# Patient Record
Sex: Female | Born: 1950 | Race: White | Hispanic: No | Marital: Single | Smoking: Never smoker
Health system: Southern US, Community
[De-identification: ages and names within clinical notes are randomized; demographics above are authoritative.]

## PROBLEM LIST (undated history)

## (undated) DIAGNOSIS — N200 Calculus of kidney: Secondary | ICD-10-CM

## (undated) DIAGNOSIS — I10 Essential (primary) hypertension: Secondary | ICD-10-CM

## (undated) HISTORY — PX: OTHER SURGICAL HISTORY: SHX169

## (undated) HISTORY — PX: DILATION AND CURETTAGE, DIAGNOSTIC / THERAPEUTIC: SUR384

---

## 1998-05-14 ENCOUNTER — Other Ambulatory Visit: Admission: RE | Admit: 1998-05-14 | Discharge: 1998-05-14 | Payer: Self-pay | Admitting: Obstetrics and Gynecology

## 1998-12-24 ENCOUNTER — Encounter: Payer: Self-pay | Admitting: Urology

## 1998-12-24 ENCOUNTER — Encounter: Admission: RE | Admit: 1998-12-24 | Discharge: 1998-12-24 | Payer: Self-pay | Admitting: Urology

## 1999-07-14 ENCOUNTER — Encounter: Admission: RE | Admit: 1999-07-14 | Discharge: 1999-07-14 | Payer: Self-pay | Admitting: Urology

## 1999-07-14 ENCOUNTER — Encounter: Payer: Self-pay | Admitting: Urology

## 1999-10-03 ENCOUNTER — Other Ambulatory Visit: Admission: RE | Admit: 1999-10-03 | Discharge: 1999-10-03 | Payer: Self-pay | Admitting: Obstetrics and Gynecology

## 2000-01-09 ENCOUNTER — Encounter: Payer: Self-pay | Admitting: Urology

## 2000-01-09 ENCOUNTER — Encounter: Admission: RE | Admit: 2000-01-09 | Discharge: 2000-01-09 | Payer: Self-pay | Admitting: Urology

## 2000-05-28 ENCOUNTER — Encounter: Payer: Self-pay | Admitting: Urology

## 2000-05-28 ENCOUNTER — Encounter: Admission: RE | Admit: 2000-05-28 | Discharge: 2000-05-28 | Payer: Self-pay | Admitting: Urology

## 2001-04-27 ENCOUNTER — Encounter: Payer: Self-pay | Admitting: Urology

## 2001-04-27 ENCOUNTER — Encounter: Admission: RE | Admit: 2001-04-27 | Discharge: 2001-04-27 | Payer: Self-pay | Admitting: Urology

## 2001-05-23 ENCOUNTER — Other Ambulatory Visit: Admission: RE | Admit: 2001-05-23 | Discharge: 2001-05-23 | Payer: Self-pay | Admitting: Obstetrics and Gynecology

## 2003-03-15 ENCOUNTER — Ambulatory Visit (HOSPITAL_COMMUNITY): Admission: RE | Admit: 2003-03-15 | Discharge: 2003-03-15 | Payer: Self-pay | Admitting: Urology

## 2003-03-26 ENCOUNTER — Other Ambulatory Visit: Admission: RE | Admit: 2003-03-26 | Discharge: 2003-03-26 | Payer: Self-pay | Admitting: Obstetrics and Gynecology

## 2003-04-04 ENCOUNTER — Encounter: Admission: RE | Admit: 2003-04-04 | Discharge: 2003-04-04 | Payer: Self-pay | Admitting: Obstetrics and Gynecology

## 2004-05-15 ENCOUNTER — Other Ambulatory Visit: Admission: RE | Admit: 2004-05-15 | Discharge: 2004-05-15 | Payer: Self-pay | Admitting: Obstetrics and Gynecology

## 2004-08-03 ENCOUNTER — Emergency Department (HOSPITAL_COMMUNITY): Admission: EM | Admit: 2004-08-03 | Discharge: 2004-08-03 | Payer: Self-pay | Admitting: Emergency Medicine

## 2010-03-15 ENCOUNTER — Encounter: Payer: Self-pay | Admitting: Obstetrics and Gynecology

## 2014-11-19 ENCOUNTER — Encounter: Payer: Self-pay | Admitting: Family Medicine

## 2014-11-19 ENCOUNTER — Other Ambulatory Visit (INDEPENDENT_AMBULATORY_CARE_PROVIDER_SITE_OTHER): Payer: Commercial Managed Care - HMO

## 2014-11-19 ENCOUNTER — Ambulatory Visit (INDEPENDENT_AMBULATORY_CARE_PROVIDER_SITE_OTHER): Payer: Commercial Managed Care - HMO | Admitting: Family Medicine

## 2014-11-19 VITALS — BP 140/78 | HR 81 | Ht 65.0 in | Wt 199.0 lb

## 2014-11-19 DIAGNOSIS — M25569 Pain in unspecified knee: Secondary | ICD-10-CM

## 2014-11-19 DIAGNOSIS — M129 Arthropathy, unspecified: Secondary | ICD-10-CM | POA: Diagnosis not present

## 2014-11-19 DIAGNOSIS — IMO0002 Reserved for concepts with insufficient information to code with codable children: Secondary | ICD-10-CM | POA: Insufficient documentation

## 2014-11-19 NOTE — Assessment & Plan Note (Signed)
Severe arthritis of the knees bilaterally. We discussed different treatment options patient though has elected to try conservative therapy. Patient will try home exercises and work with athletic trainer today, icing, what activities to avoid, discussed proper shoewear. Patient given trial topical anti-inflammatory's. In addition of this patient will try over-the-counter natural supplementations. Patient and will come back and see me again in 4 weeks. Continuing to have pain we will need to consider possible injections. X-rays pending.

## 2014-11-19 NOTE — Patient Instructions (Addendum)
Good to see you.  Ice 20 minutes 2 times daily. Usually after activity and before bed. Exercises 3 times a week.  pennsaid pinkie amount topically 2 times daily as needed.  Take tylenol 650 mg three times a day is the best evidence based medicine we have for arthritis.  Glucosamine sulfate 1500mg  a day is a supplement that has been shown to help moderate to severe arthritis. Vitamin D 2000 IU daily Fish oil 2 grams daily.  Tumeric 500mg  daily.  Capsaicin topically up to four times a day may also help with pain. It's important that you continue to stay active. Controlling your weight is important.  Good shoes with rigid bottom.  Allison Sanders, [place] or New balance greater then [employer] and cycling with low resistance are the best two types of exercise for arthritis. Come back and see me in 4 weeks.

## 2014-11-19 NOTE — Progress Notes (Signed)
Pre visit review using our clinic review tool, if applicable. No additional management support is needed unless otherwise documented below in the visit note. 

## 2014-11-19 NOTE — Progress Notes (Signed)
Allison Sanders, Allison Sanders Phone: [phone] Subjective:    I'm seeing this patient by the request  of:  Allison Sanders,Allison Sanders, Allison Sanders   CC: bilateral knee pain  Allison Sanders is a 64 y.o. female coming in with complaint of bilateral knee pain. Patient is had this pain for probably 2 years. Patient remembers going up and downstairs numerous time in one day and then unfortunately having the pain. Seemed to start in the right knee and now is on by both knees. Patient has been seen in urgent care 2 years ago and was told that her x-rays were normal. Patient states that the pain is on the medial aspect of the knees. Can stop her from certain activities. Worse when going up or downstairs. Describes pain as a more dull, throbbing aching pain. Denies any radiation of the legs or any back pain. Patient rates the severity of pain a 7 out of 10. Feels like it is worsening over the course of time. Patient did have a back pain with radicular symptoms not long ago by primary care physician and was given prednisone and states that this was the only thing that is help her knees recently. When she was on the medication she was feeling much better.  No past medical history on file.  No past surgical history on file. Social History  Substance Use Topics  . Smoking status: Never Smoker   . Smokeless tobacco: Not on file  . Alcohol Use: Not on file   Not on File No family history on file.      Past medical history, social, surgical and family history all reviewed in electronic medical record.   Review of Systems: No headache, visual changes, nausea, vomiting, diarrhea, constipation, dizziness, abdominal pain, skin rash, fevers, chills, night sweats, weight loss, swollen lymph nodes, body aches, joint swelling, muscle aches, chest pain, shortness of breath, mood changes.   Objective Blood pressure 140/78, pulse 81, height 5\' 5"  (1.651  m), weight 199 lb (90.266 kg), SpO2 94 %.  General: No apparent distress alert and oriented x3 mood and affect normal, dressed appropriately.  HEENT: Pupils equal, extraocular movements intact  Respiratory: Patient's speak in full sentences and does not appear short of breath  Cardiovascular: No lower extremity edema, non tender, no erythema  Skin: Warm dry intact with no signs of infection or rash on extremities or on axial skeleton.  Abdomen: Soft nontender  Neuro: Cranial nerves II through XII are intact, neurovascularly intact in all extremities with 2+ DTRs and 2+ pulses.  Lymph: No lymphadenopathy of posterior or anterior cervical chain or axillae bilaterally.  Gait normal with good balance and coordination.  MSK:  Non tender with full range of motion and good stability and symmetric strength and tone of shoulders, elbows, wrist, hip, and ankles bilaterally. Knee:bilateral Noticed deformity of the knees bilaterally Tenderness to palpation over the medial joint line ROM full in flexion and extension and lower leg rotation. Ligaments with solid consistent endpoints including ACL, PCL, LCL, MCL. mildpainful patellar compression. Patellar glide with moderatecrepitus. Patellar and quadriceps tendons unremarkable. Hamstring and quadriceps strength is normal.    MSK US performed of: bilateral knees This study was ordered, performed, and interpreted by Charlann Boxer Sanders.O.  Knee: All structures visualized. Severe narrowing of the medial joint line bilaterally Patellar Tendon unremarkable on long and transverse views without effusion. No abnormality of prepatellar bursa. LCL and MCL unremarkable on long and  transverse views. No abnormality of origin of medial or lateral head of the gastrocnemius.  IMPRESSION:  Severe medial compartment arthritis bilaterally     Impression and Recommendations:     This case required medical decision making of moderate complexity.

## 2014-12-17 ENCOUNTER — Ambulatory Visit (INDEPENDENT_AMBULATORY_CARE_PROVIDER_SITE_OTHER): Payer: Commercial Managed Care - HMO | Admitting: Family Medicine

## 2014-12-17 ENCOUNTER — Encounter: Payer: Self-pay | Admitting: Family Medicine

## 2014-12-17 VITALS — BP 132/86 | HR 70 | Ht 65.0 in | Wt 202.0 lb

## 2014-12-17 DIAGNOSIS — IMO0002 Reserved for concepts with insufficient information to code with codable children: Secondary | ICD-10-CM

## 2014-12-17 DIAGNOSIS — M129 Arthropathy, unspecified: Secondary | ICD-10-CM

## 2014-12-17 NOTE — Assessment & Plan Note (Signed)
Discussed with patient at great length. Patient will continue with the conservative therapy. We discussed icing regimen. We discussed of activity or anything seems to worsen the pain she should come back. We have many different injections and other options such as physical therapy if necessary. His lungs patient does well she will follow-up as needed.

## 2014-12-17 NOTE — Progress Notes (Signed)
  [name] [place], [place] [address] Phone: [phone] Subjective:    CC: bilateral knee painfollow-up  [id number] Allison Sanders is a 64 y.o. female coming in with complaint of bilateral knee pain. Patient was seen previously was diagnosed with severe medial compartment osteophytic changes of the knees. Patient elected try conservative therapy including home exercises, icing protocol, topical anti-inflammatories and over-the-counter natural supplements. Patient states that she is a proximally 70% better. No pain at night. Able to do more activities. Still soreness is from time to time but nothing as bad as it worse. Patient is very happy with the results..  No past medical history on file.  No past surgical history on file. Social History  Substance Use Topics  . Smoking status: Never Smoker   . Smokeless tobacco: None  . Alcohol Use: None   Not on File No family history on file.      Past medical history, social, surgical and family history all reviewed in electronic medical record.   Review of Systems: No headache, visual changes, nausea, vomiting, diarrhea, constipation, dizziness, abdominal pain, skin rash, fevers, chills, night sweats, weight loss, swollen lymph nodes, body aches, joint swelling, muscle aches, chest pain, shortness of breath, mood changes.   Objective Blood pressure 132/86, pulse 70, height 5\' 5"  (1.651 m), weight 202 lb (91.627 kg), SpO2 98 %.  General: No apparent distress alert and oriented x3 mood and affect normal, dressed appropriately.  HEENT: Pupils equal, extraocular movements intact  Respiratory: Patient's speak in full sentences and does not appear short of breath  Cardiovascular: No lower extremity edema, non tender, no erythema  Skin: Warm dry intact with no signs of infection or rash on extremities or on axial skeleton.  Abdomen: Soft nontender  Neuro: Cranial nerves II through XII are  intact, neurovascularly intact in all extremities with 2+ DTRs and 2+ pulses.  Lymph: No lymphadenopathy of posterior or anterior cervical chain or axillae bilaterally.  Gait normal with good balance and coordination.  MSK:  Non tender with full range of motion and good stability and symmetric strength and tone of shoulders, elbows, wrist, hip, and ankles bilaterally. Knee:bilateral Noticed deformity of the knees bilaterally Tenderness to palpation over the medial joint line ROM full in flexion and extension and lower leg rotation. Ligaments with solid consistent endpoints including ACL, PCL, LCL, MCL. mildpainful patellar compression. Patellar glide with moderatecrepitus. Patellar and quadriceps tendons unremarkable. Hamstring and quadriceps strength is normal.         Impression and Recommendations:     This case required medical decision making of moderate complexity.

## 2014-12-17 NOTE — Progress Notes (Signed)
Pre visit review using our clinic review tool, if applicable. No additional management support is needed unless otherwise documented below in the visit note. 

## 2015-01-28 ENCOUNTER — Ambulatory Visit: Payer: Commercial Managed Care - HMO | Admitting: Family Medicine

## 2015-08-14 ENCOUNTER — Ambulatory Visit (INDEPENDENT_AMBULATORY_CARE_PROVIDER_SITE_OTHER)
Admission: RE | Admit: 2015-08-14 | Discharge: 2015-08-14 | Disposition: A | Discharge status: Home / Self Care | Payer: Medicare HMO | Source: Ambulatory Visit | Attending: Family Medicine | Admitting: Family Medicine

## 2015-08-14 ENCOUNTER — Ambulatory Visit (INDEPENDENT_AMBULATORY_CARE_PROVIDER_SITE_OTHER): Payer: Medicare HMO | Admitting: Family Medicine

## 2015-08-14 ENCOUNTER — Encounter: Payer: Self-pay | Admitting: Family Medicine

## 2015-08-14 DIAGNOSIS — M5136 Other intervertebral disc degeneration, lumbar region: Secondary | ICD-10-CM | POA: Diagnosis not present

## 2015-08-14 DIAGNOSIS — M5441 Lumbago with sciatica, right side: Secondary | ICD-10-CM | POA: Diagnosis not present

## 2015-08-14 DIAGNOSIS — M5440 Lumbago with sciatica, unspecified side: Secondary | ICD-10-CM

## 2015-08-14 DIAGNOSIS — M545 Low back pain, unspecified: Secondary | ICD-10-CM | POA: Insufficient documentation

## 2015-08-14 MED ORDER — METHYLPREDNISOLONE ACETATE 80 MG/ML IJ SUSP
80.0000 mg | Freq: Once | INTRAMUSCULAR | Status: AC
  Administered 2015-08-14: 80 mg via INTRAMUSCULAR

## 2015-08-14 MED ORDER — GABAPENTIN 100 MG PO CAPS: 200.0000 mg | ORAL_CAPSULE | Freq: Every day | ORAL | Status: DC

## 2015-08-14 MED ORDER — PREDNISONE 50 MG PO TABS: 50.0000 mg | ORAL_TABLET | Freq: Every day | ORAL | Status: DC

## 2015-08-14 MED ORDER — KETOROLAC TROMETHAMINE 60 MG/2ML IM SOLN
60.0000 mg | Freq: Once | INTRAMUSCULAR | Status: AC
  Administered 2015-08-14: 60 mg via INTRAMUSCULAR

## 2015-08-14 NOTE — Assessment & Plan Note (Signed)
Patient is having some low back pain. Mild radicular symptoms going to the right posterior buttock cheek. Discussed with patient at great length. Patient will take prednisone. Patient given 2 injections today. Also given gabapentin for night relief. Discussed with patient if worsening symptoms she needs to seek medical attention. X-rays pending. Follow-up again in 3 weeks and at that time we'll likely start formal physical therapy.

## 2015-08-14 NOTE — Addendum Note (Signed)
Addended by: Douglass Rivers T on: 08/14/2015 02:45 PM   Modules accepted: Orders

## 2015-08-14 NOTE — Patient Instructions (Signed)
Good to see you  Ice 20 minutes 2 times daily. Usually after activity and before bed. 2 injections today  Prednisone daily for 5 days.  Gabapentin 100mg  at night for first week then 200mg  thereafter  Tylenol 500mg  3 times daily  Tart cherry extract any dose Go down and get xrays when you leave Have a great trip and see me again in 3 weeks at that time we will start you either on home exercises or physical therapy

## 2015-08-14 NOTE — Progress Notes (Signed)
[name] [place], [place] [address] Phone: [phone] Subjective:    CC: Bilateral low back pain  [id number] Allison Sanders is a 65 y.o. female coming in with complaint of bilateral low back pain. States that she has had this intermittently for multiple years. Recently over the last month so this has become a constant pain. Seems to be more on the low back. States that pain is 8 out of 10. Significant severity that can catch her breath. Patient states that it seems to be radiating to her buttocks bilaterally right greater than left. Patient states that he can even wake her up at night. Denies any fevers or chills. Has noticed it significantly worse with her gaining weight over the course last several months. Patient has had increasing stress. Has recently retired and states that she does not know if she is having a good time with this. Worse when moving quadriplegic son.    No past medical history on file. No past surgical history on file. Social History   Social History  . Marital Status: Single    Spouse Name: N/A  . Number of Children: N/A  . Years of Education: N/A   Social History Main Topics  . Smoking status: Never Smoker   . Smokeless tobacco: Not on file  . Alcohol Use: Not on file  . Drug Use: Not on file  . Sexual Activity: Not on file   Other Topics Concern  . Not on file   Social History Narrative   Allergies  Allergen Reactions  . Contrast Media [Iodinated Diagnostic Agents]   . Penicillins   . Sulfa Antibiotics    No family history on file. No family history of rheumatological diseases.  Past medical history, social, surgical and family history all reviewed in electronic medical record.  No pertanent information unless stated regarding to the chief complaint.   Review of Systems: No headache, visual changes, nausea, vomiting, diarrhea, constipation, dizziness, abdominal pain, skin rash, fevers, chills,  night sweats, weight loss, swollen lymph nodes, body aches, joint swelling, muscle aches, chest pain, shortness of breath, mood changes.   Objective Blood pressure 134/80, pulse 84, weight 205 lb (92.987 kg).  General: No apparent distress alert and oriented x3 mood and affect normal, dressed appropriately.  HEENT: Pupils equal, extraocular movements intact  Respiratory: Patient's speak in full sentences and does not appear short of breath  Cardiovascular: No lower extremity edema, non tender, no erythema  Skin: Warm dry intact with no signs of infection or rash on extremities or on axial skeleton.  Abdomen: Soft nontender  Neuro: Cranial nerves II through XII are intact, neurovascularly intact in all extremities with 2+ DTRs and 2+ pulses.  Lymph: No lymphadenopathy of posterior or anterior cervical chain or axillae bilaterally.  Gait normal with good balance and coordination.  MSK:  Non tender with full range of motion and good stability and symmetric strength and tone of shoulders, elbows, wrist, hip, knee and ankles bilaterally. 3 changes of multiple joints. Back Exam:  Inspection: Mild to moderate scoliosis noted at the thoracolumbar juncture. Motion: Flexion 25 deg, Extension 15 deg, Side Bending to 25 deg bilaterally,  Rotation to 25 deg bilaterally  SLR laying: Negative tightness of the hamstring bilaterally XSLR laying: Negative  Palpable tenderness: Severe tenderness in the paraspinal musculature of the L5-S1. Mild palpable step-offs noted FABER: Patient is significant doing tighter bilaterally Sensory change: Gross sensation intact to all lumbar and sacral dermatomes.  Reflexes: 2+ at both patellar tendons, 2+ at achilles tendons, Babinski's downgoing.  Strength at foot  Plantar-flexion: 5/5 Dorsi-flexion: 5/5 Eversion: 5/5 Inversion: 5/5  Leg strength  Strength is 4 out of 5 but symmetric     Impression and Recommendations:     This case required medical decision making  of moderate complexity.      Note: This dictation was prepared with Dragon dictation along with smaller phrase technology. Any transcriptional errors that result from this process are unintentional.

## 2015-08-15 ENCOUNTER — Encounter: Payer: Self-pay | Admitting: Family Medicine

## 2015-09-04 ENCOUNTER — Ambulatory Visit (INDEPENDENT_AMBULATORY_CARE_PROVIDER_SITE_OTHER): Payer: Medicare HMO | Admitting: Family Medicine

## 2015-09-04 ENCOUNTER — Encounter: Payer: Self-pay | Admitting: Family Medicine

## 2015-09-04 VITALS — BP 130/82 | HR 73 | Ht 65.0 in | Wt 202.0 lb

## 2015-09-04 DIAGNOSIS — M5136 Other intervertebral disc degeneration, lumbar region: Secondary | ICD-10-CM | POA: Diagnosis not present

## 2015-09-04 MED ORDER — NORTRIPTYLINE HCL 10 MG PO CAPS: 10.0000 mg | ORAL_CAPSULE | Freq: Every day | ORAL | Status: DC

## 2015-09-04 MED ORDER — TIZANIDINE HCL 2 MG PO CAPS: 2.0000 mg | ORAL_CAPSULE | Freq: Three times a day (TID) | ORAL | Status: DC | PRN

## 2015-09-04 NOTE — Patient Instructions (Signed)
Good to se eyou  Nortriptyline 10 mg at night may help with the nerve pain and help with sleep.  Zanaflex is a muscle relaxer and if in a lot of pain can try it, but make sure it does not make you sleepy I love the idea of water aerobics but only 2 times a week for first 2 weeks then can go up to 3 times a week. Biking would be good as well.  Ice when you need it For the butt pain consider a tennisball in the back left pocket with sitting.  See me again in 6 weeks.

## 2015-09-04 NOTE — Progress Notes (Signed)
Pre visit review using our clinic review tool, if applicable. No additional management support is needed unless otherwise documented below in the visit note. 

## 2015-09-04 NOTE — Assessment & Plan Note (Signed)
Degenerative disc disease of the lumbar spine. We discussed with patient about different treatment options. Patient is going to start increasing her activity on her own. Her scheduled for nortriptyline as well as muscle relaxer given. We discussed if any worsening symptoms or radicular symptoms advance imaging may be warranted. Patient will continue the over-the-counter natural supplementations as well. We discussed the importance of icing as well as monitoring her weight. Patient and will follow-up with me again in 6 weeks for further evaluation and treatment.  Spent  25 minutes with patient face-to-face and had greater than 50% of counseling including as described above in assessment and plan.

## 2015-09-04 NOTE — Progress Notes (Signed)
[name] [place], [place] [address] Phone: [phone] Subjective:    CC: Bilateral low back pain Follow-up  [id number] Allison Sanders is a 65 y.o. female coming in with complaint of bilateral low back pain. Patient appeared to have some exacerbation of her low back pain. Questionable spinal stenosis first possible worsening degenerative disc disease. Patient was given prednisone, gabapentin as well as icing regimen. States that the prednisone helped out significantly. Patient was able to start walking 10,000 steps daily without any significant discomfort. Pain is worsening a little bit but not severe. Unable to tolerate the gabapentin due to dizziness. Patient is happy with the results but is concerned to some of the pain is starting to come back. Patient is also having what appears to be fatigue of her muscle she states. Denies any significant radiation down the legs any numbness or tingling. Overall though this is the best her back is felted year she states.   Patient did have x-rays after last exam. These were independently visualized by me showing patient having scoliosis thoracolumbar scoliosis as well as severe facet degenerative changes of the lumbar spine at L4-L5 and L5-S1 No past medical history on file. No past surgical history on file. Social History   Social History  . Marital Status: Single    Spouse Name: N/A  . Number of Children: N/A  . Years of Education: N/A   Social History Main Topics  . Smoking status: Never Smoker   . Smokeless tobacco: None  . Alcohol Use: None  . Drug Use: None  . Sexual Activity: Not Asked   Other Topics Concern  . None   Social History Narrative   Allergies  Allergen Reactions  . Contrast Media [Iodinated Diagnostic Agents]   . Penicillins   . Sulfa Antibiotics    No family history on file. No family history of rheumatological diseases.  Past medical history, social, surgical  and family history all reviewed in electronic medical record.  No pertanent information unless stated regarding to the chief complaint.   Review of Systems: No headache, visual changes, nausea, vomiting, diarrhea, constipation, dizziness, abdominal pain, skin rash, fevers, chills, night sweats, weight loss, swollen lymph nodes, body aches, joint swelling, muscle aches, chest pain, shortness of breath, mood changes.   Objective Blood pressure 130/82, pulse 73, height 5\' 5"  (1.651 m), weight 202 lb (91.627 kg), SpO2 98 %.  General: No apparent distress alert and oriented x3 mood and affect normal, dressed appropriately.  HEENT: Pupils equal, extraocular movements intact  Respiratory: Patient's speak in full sentences and does not appear short of breath  Cardiovascular: No lower extremity edema, non tender, no erythema  Skin: Warm dry intact with no signs of infection or rash on extremities or on axial skeleton.  Abdomen: Soft nontender  Neuro: Cranial nerves II through XII are intact, neurovascularly intact in all extremities with 2+ DTRs and 2+ pulses.  Lymph: No lymphadenopathy of posterior or anterior cervical chain or axillae bilaterally.  Gait normal with good balance and coordination.  MSK:  Non tender with full range of motion and good stability and symmetric strength and tone of shoulders, elbows, wrist, hip, knee and ankles bilaterally. 3 changes of multiple joints. Back Exam:  Inspection: Mild to moderate scoliosis noted at the thoracolumbar juncture. Motion: Flexion 35 deg, Extension 15 deg, Side Bending to 25 deg bilaterally,  Rotation to 25 deg bilaterally  SLR laying: Negative tightness of the hamstring bilaterally Still  present but improving XSLR laying: Negative  Palpable tenderness: Significant less tenderness than previous exam. FABER: Patient is significant doing tighter bilaterally Sensory change: Gross sensation intact to all lumbar and sacral dermatomes.  Reflexes: 2+ at  both patellar tendons, 2+ at achilles tendons, Babinski's downgoing.  Strength at foot  Plantar-flexion: 5/5 Dorsi-flexion: 5/5 Eversion: 5/5 Inversion: 5/5  Leg strength  Strength is 4+ out of 5 but symmetric And improving     Impression and Recommendations:     This case required medical decision making of moderate complexity.      Note: This dictation was prepared with Dragon dictation along with smaller phrase technology. Any transcriptional errors that result from this process are unintentional.

## 2015-09-05 ENCOUNTER — Encounter: Payer: Self-pay | Admitting: Family Medicine

## 2015-10-16 ENCOUNTER — Ambulatory Visit: Payer: Medicare HMO | Admitting: Family Medicine

## 2016-10-20 ENCOUNTER — Ambulatory Visit (INDEPENDENT_AMBULATORY_CARE_PROVIDER_SITE_OTHER): Payer: Medicare HMO | Admitting: Family Medicine

## 2016-10-20 ENCOUNTER — Ambulatory Visit: Payer: Self-pay

## 2016-10-20 VITALS — BP 160/100 | HR 72 | Wt 185.0 lb

## 2016-10-20 DIAGNOSIS — M25522 Pain in left elbow: Secondary | ICD-10-CM

## 2016-10-20 DIAGNOSIS — M25521 Pain in right elbow: Secondary | ICD-10-CM

## 2016-10-20 DIAGNOSIS — G5603 Carpal tunnel syndrome, bilateral upper limbs: Secondary | ICD-10-CM | POA: Insufficient documentation

## 2016-10-20 NOTE — Patient Instructions (Signed)
God to see you  Allison Sanders is your friend Exercises 3 times a week.  Wear brace at night Injected the right side today  Have many more tricks if we need it.  See me again in 4 weeks.

## 2016-10-20 NOTE — Progress Notes (Signed)
[facility] [address] [place], [place] [address] Phone: [phone] Subjective:    I'm seeing this patient by the request  of:    CC:   Bilateral arm pain  YHC:[id number]  Allison Sanders is a 66 y.o. female coming in with complaint of bilateral elbow pain. She has had the pain for the past 6 months and said that she notes most of the pain is when she is using her arms and hands to do therapy for her son. She said that the pain started after using an iPad for a prolonged period of time during a power outage this winter. She states that her left elbow is tender to touch but the right is not as tender.  Onset- 6 months ago Location- bilateral elbows Duration- intermittent Character-dull Aggravating factors- use of her arms against external resistance Reliving factors-  Therapies tried-  Severity-     No past medical history on file. No past surgical history on file. Social History   Social History  . Marital status: Single    Spouse name: N/A  . Number of children: N/A  . Years of education: N/A   Social History Main Topics  . Smoking status: Never Smoker  . Smokeless tobacco: Not on file  . Alcohol use Not on file  . Drug use: Unknown  . Sexual activity: Not on file   Other Topics Concern  . Not on file   Social History Narrative  . No narrative on file   Allergies  Allergen Reactions  . Contrast Media [Iodinated Diagnostic Agents]   . Penicillins   . Sulfa Antibiotics    No family history on file.   Past medical history, social, surgical and family history all reviewed in electronic medical record.  No pertanent information unless stated regarding to the chief complaint.   Review of Systems:Review of systems updated and as accurate as of 10/20/16  No headache, visual changes, nausea, vomiting, diarrhea, constipation, dizziness, abdominal pain, skin rash, fevers, chills, night sweats, weight loss, swollen lymph nodes, body  aches, joint swelling,  chest pain, shortness of breath, mood changes. Positive muscle aches  Objective  Blood pressure (!) 160/100, pulse 72, weight 185 lb (83.9 kg). Systems examined below as of 10/20/16   General: No apparent distress alert and oriented x3 mood and affect normal, dressed appropriately.  HEENT: Pupils equal, extraocular movements intact  Respiratory: Patient's speak in full sentences and does not appear short of breath  Cardiovascular: No lower extremity edema, non tender, no erythema  Skin: Warm dry intact with no signs of infection or rash on extremities or on axial skeleton.  Abdomen: Soft nontender  Neuro: Cranial nerves II through XII are intact, neurovascularly intact in all extremities with 2+ DTRs and 2+ pulses.  Lymph: No lymphadenopathy of posterior or anterior cervical chain or axillae bilaterally.  Gait normal with good balance and coordination.  MSK:  Non tender with full range of motion and good stability and symmetric strength and tone of shoulders, elbows,  hip, knee and ankles bilaterally. Moderate arthritic changes of multiple joints Wrist: Bilateral Inspection normal with no visible erythema or swelling. ROM smooth and normal with good flexion and extension and ulnar/radial deviation that is symmetrical with opposite wrist. Palpation is normal over metacarpals, navicular, lunate, and TFCC; tendons without tenderness/ swelling No snuffbox tenderness. No tenderness over Canal of Guyon. Strength 5/5 in all directions without pain. Negative Finkelstein, positive tinel's and phalens. Negative Watson's test.  Neck: Inspection loss of lordosis. No palpable stepoffs. Negative Spurling's maneuver. Lacks last 10 of extension and 5 of side many bilaterally Grip strength and sensation normal in bilateral hands Strength good C4 to T1 distribution No sensory change to C4 to T1 Negative Hoffman sign bilaterally Reflexes normal  MSK US performed of: Right  wrist This study was ordered, performed, and interpreted by Charlann Boxer D.O.  Wrist: All extensor compartments visualized and tendons all normal in appearance without fraying, tears, or sheath effusions. Carpal tunnel was enlarged with patient's median nerve measuring 0.260 cm. Nearly 50% better than he needs to be. Left side measuring 0.16 cm.  IMPRESSION:  Consistent with bilateral carpal tunnel right greater than left  Procedure: Real-time Ultrasound Guided Injection of right carpal tunnel Device: GE Logiq Q7  Ultrasound guided injection is preferred based studies that show increased duration, increased effect, greater accuracy, decreased procedural pain, increased response rate with ultrasound guided versus blind injection.  Verbal informed consent obtained.  Time-out conducted.  Noted no overlying erythema, induration, or other signs of local infection.  Skin prepped in a sterile fashion.  Local anesthesia: Topical Ethyl chloride.  With sterile technique and under real time ultrasound guidance:  median nerve visualized.  23g 5/8 inch needle inserted distal to proximal approach into nerve sheath. Pictures taken nfor needle placement. Patient did have injection of 2 cc of 1% lidocaine, 1 cc of 0.5% Marcaine, and 1 cc of Kenalog 40 mg/dL. Completed without difficulty  Pain immediately resolved suggesting accurate placement of the medication.  Advised to call if fevers/chills, erythema, induration, drainage, or persistent bleeding.  Images permanently stored and available for review in the ultrasound unit.  Impression: Technically successful ultrasound guided injection..  [id number]; 15 additional minutes spent for Therapeutic exercises as stated in above notes.  This included exercises focusing on stretching, strengthening, with significant focus on eccentric aspects.   Long term goals include an improvement in range of motion, strength, endurance as well as avoiding reinjury. Patient's frequency  would include in 1-2 times a day, 3-5 times a week for a duration of 6-12 weeks. .= Proper technique shown and discussed handout in great detail with ATC.  All questions were discussed and answered.     Impression and Recommendations:     This case required medical decision making of moderate complexity.      Note: This dictation was prepared with Dragon dictation along with smaller phrase technology. Any transcriptional errors that result from this process are unintentional.

## 2016-10-20 NOTE — Assessment & Plan Note (Signed)
Patient did have more of a carpal tunnel syndrome. Discussed with patient at great length. I injected the right sign with patient tolerated very well. Bracing on the left side. Home exercises given, discussed topical anti-inflammatories and icing regimen. Discussed avoiding repetitive activity. Follow-up again in 4 weeks.

## 2016-11-05 DIAGNOSIS — Z6831 Body mass index (BMI) 31.0-31.9, adult: Secondary | ICD-10-CM | POA: Diagnosis not present

## 2016-11-05 DIAGNOSIS — Z124 Encounter for screening for malignant neoplasm of cervix: Secondary | ICD-10-CM | POA: Diagnosis not present

## 2016-11-05 DIAGNOSIS — N819 Female genital prolapse, unspecified: Secondary | ICD-10-CM | POA: Diagnosis not present

## 2016-11-05 DIAGNOSIS — Z1231 Encounter for screening mammogram for malignant neoplasm of breast: Secondary | ICD-10-CM | POA: Diagnosis not present

## 2016-11-06 ENCOUNTER — Other Ambulatory Visit: Payer: Self-pay | Admitting: Obstetrics and Gynecology

## 2016-11-06 DIAGNOSIS — R928 Other abnormal and inconclusive findings on diagnostic imaging of breast: Secondary | ICD-10-CM

## 2016-11-17 ENCOUNTER — Ambulatory Visit: Payer: Medicare HMO | Admitting: Family Medicine

## 2016-11-24 DIAGNOSIS — N950 Postmenopausal bleeding: Secondary | ICD-10-CM | POA: Diagnosis not present

## 2016-12-30 DIAGNOSIS — N858 Other specified noninflammatory disorders of uterus: Secondary | ICD-10-CM | POA: Diagnosis not present

## 2016-12-30 DIAGNOSIS — N840 Polyp of corpus uteri: Secondary | ICD-10-CM | POA: Diagnosis not present

## 2016-12-30 DIAGNOSIS — N950 Postmenopausal bleeding: Secondary | ICD-10-CM | POA: Diagnosis not present

## 2017-02-10 ENCOUNTER — Other Ambulatory Visit: Payer: Self-pay | Admitting: Obstetrics and Gynecology

## 2017-02-10 ENCOUNTER — Ambulatory Visit
Admission: RE | Admit: 2017-02-10 | Discharge: 2017-02-10 | Disposition: A | Discharge status: Home / Self Care | Payer: Medicare HMO | Source: Ambulatory Visit | Attending: Obstetrics and Gynecology | Admitting: Obstetrics and Gynecology

## 2017-02-10 DIAGNOSIS — R928 Other abnormal and inconclusive findings on diagnostic imaging of breast: Secondary | ICD-10-CM

## 2017-02-10 DIAGNOSIS — R921 Mammographic calcification found on diagnostic imaging of breast: Secondary | ICD-10-CM | POA: Diagnosis not present

## 2017-02-10 DIAGNOSIS — N6011 Diffuse cystic mastopathy of right breast: Secondary | ICD-10-CM | POA: Diagnosis not present

## 2017-04-05 ENCOUNTER — Other Ambulatory Visit: Payer: Self-pay

## 2017-04-05 ENCOUNTER — Encounter (HOSPITAL_COMMUNITY): Payer: Self-pay | Admitting: Cardiology

## 2017-04-05 ENCOUNTER — Encounter (HOSPITAL_COMMUNITY)
Admission: EM | Disposition: A | Discharge status: Home / Self Care | Payer: Self-pay | Source: Home / Self Care | Attending: Cardiology

## 2017-04-05 ENCOUNTER — Inpatient Hospital Stay (HOSPITAL_COMMUNITY)
Admission: EM | Admit: 2017-04-05 | Discharge: 2017-04-08 | DRG: 247 | Disposition: A | Discharge status: Home / Self Care | Payer: Medicare HMO | Attending: Cardiology | Admitting: Cardiology

## 2017-04-05 DIAGNOSIS — Z882 Allergy status to sulfonamides status: Secondary | ICD-10-CM

## 2017-04-05 DIAGNOSIS — M5136 Other intervertebral disc degeneration, lumbar region: Secondary | ICD-10-CM | POA: Diagnosis not present

## 2017-04-05 DIAGNOSIS — Z880 Allergy status to penicillin: Secondary | ICD-10-CM | POA: Diagnosis not present

## 2017-04-05 DIAGNOSIS — Z8249 Family history of ischemic heart disease and other diseases of the circulatory system: Secondary | ICD-10-CM

## 2017-04-05 DIAGNOSIS — M1990 Unspecified osteoarthritis, unspecified site: Secondary | ICD-10-CM | POA: Diagnosis present

## 2017-04-05 DIAGNOSIS — Z87442 Personal history of urinary calculi: Secondary | ICD-10-CM

## 2017-04-05 DIAGNOSIS — I213 ST elevation (STEMI) myocardial infarction of unspecified site: Secondary | ICD-10-CM | POA: Diagnosis not present

## 2017-04-05 DIAGNOSIS — Z91041 Radiographic dye allergy status: Secondary | ICD-10-CM

## 2017-04-05 DIAGNOSIS — I2510 Atherosclerotic heart disease of native coronary artery without angina pectoris: Secondary | ICD-10-CM | POA: Diagnosis present

## 2017-04-05 DIAGNOSIS — R079 Chest pain, unspecified: Secondary | ICD-10-CM | POA: Diagnosis not present

## 2017-04-05 DIAGNOSIS — Z905 Acquired absence of kidney: Secondary | ICD-10-CM | POA: Diagnosis not present

## 2017-04-05 DIAGNOSIS — I2109 ST elevation (STEMI) myocardial infarction involving other coronary artery of anterior wall: Secondary | ICD-10-CM | POA: Diagnosis not present

## 2017-04-05 DIAGNOSIS — R000 Tachycardia, unspecified: Secondary | ICD-10-CM | POA: Diagnosis not present

## 2017-04-05 DIAGNOSIS — I2102 ST elevation (STEMI) myocardial infarction involving left anterior descending coronary artery: Secondary | ICD-10-CM | POA: Diagnosis not present

## 2017-04-05 DIAGNOSIS — I493 Ventricular premature depolarization: Secondary | ICD-10-CM | POA: Diagnosis not present

## 2017-04-05 DIAGNOSIS — I10 Essential (primary) hypertension: Secondary | ICD-10-CM | POA: Diagnosis not present

## 2017-04-05 DIAGNOSIS — E876 Hypokalemia: Secondary | ICD-10-CM | POA: Diagnosis present

## 2017-04-05 DIAGNOSIS — Z79899 Other long term (current) drug therapy: Secondary | ICD-10-CM | POA: Diagnosis not present

## 2017-04-05 DIAGNOSIS — Z955 Presence of coronary angioplasty implant and graft: Secondary | ICD-10-CM

## 2017-04-05 HISTORY — DX: Calculus of kidney: N20.0

## 2017-04-05 HISTORY — PX: CORONARY/GRAFT ACUTE MI REVASCULARIZATION: CATH118305

## 2017-04-05 HISTORY — PX: LEFT HEART CATH AND CORONARY ANGIOGRAPHY: CATH118249

## 2017-04-05 HISTORY — PX: CORONARY STENT INTERVENTION: CATH118234

## 2017-04-05 HISTORY — DX: Essential (primary) hypertension: I10

## 2017-04-05 LAB — CBC
HCT: 40.6 % (ref 36.0–46.0)
Hemoglobin: 14.3 g/dL (ref 12.0–15.0)
MCH: 31.7 pg (ref 26.0–34.0)
MCHC: 35.2 g/dL (ref 30.0–36.0)
MCV: 90 fL (ref 78.0–100.0)
PLATELETS: 226 10*3/uL (ref 150–400)
RBC: 4.51 MIL/uL (ref 3.87–5.11)
RDW: 12.4 % (ref 11.5–15.5)
WBC: 11.5 10*3/uL — AB (ref 4.0–10.5)

## 2017-04-05 LAB — LIPID PANEL
Cholesterol: 175 mg/dL (ref 0–200)
Cholesterol: 167 mg/dL (ref 0–200)
HDL: 53 mg/dL (ref >40)
HDL: 51 mg/dL (ref >40)
LDL CALC: 95 mg/dL (ref 0–99)
LDL CALC: 91 mg/dL (ref 0–99)
TRIGLYCERIDES: 137 mg/dL (ref <150)
Total CHOL/HDL Ratio: 3.3 RATIO
Total CHOL/HDL Ratio: 3.3 RATIO
Triglycerides: 123 mg/dL (ref <150)
VLDL: 27 mg/dL (ref 0–40)
VLDL: 25 mg/dL (ref 0–40)

## 2017-04-05 LAB — PROTIME-INR
INR: 1.08
Prothrombin Time: 13.9 seconds (ref 11.4–15.2)

## 2017-04-05 LAB — TROPONIN I: TROPONIN I: 0.11 ng/mL — AB (ref <0.03)

## 2017-04-05 LAB — COMPREHENSIVE METABOLIC PANEL
ALT: 19 U/L (ref 14–54)
AST: 29 U/L (ref 15–41)
Albumin: 3.7 g/dL (ref 3.5–5.0)
Alkaline Phosphatase: 55 U/L (ref 38–126)
Anion gap: 15 (ref 5–15)
BUN: 18 mg/dL (ref 6–20)
CHLORIDE: 102 mmol/L (ref 101–111)
CO2: 21 mmol/L — AB (ref 22–32)
CREATININE: 1.1 mg/dL — AB (ref 0.44–1.00)
Calcium: 9.6 mg/dL (ref 8.9–10.3)
GFR calc Af Amer: 59 mL/min — ABNORMAL LOW (ref >60)
GFR calc non Af Amer: 51 mL/min — ABNORMAL LOW (ref >60)
Glucose, Bld: 193 mg/dL — ABNORMAL HIGH (ref 65–99)
Potassium: 2.9 mmol/L — ABNORMAL LOW (ref 3.5–5.1)
SODIUM: 138 mmol/L (ref 135–145)
Total Bilirubin: 0.8 mg/dL (ref 0.3–1.2)
Total Protein: 6.6 g/dL (ref 6.5–8.1)

## 2017-04-05 LAB — HEMOGLOBIN A1C
Hgb A1c MFr Bld: 5.7 % — ABNORMAL HIGH (ref 4.8–5.6)
MEAN PLASMA GLUCOSE: 116.89 mg/dL

## 2017-04-05 SURGERY — CORONARY/GRAFT ACUTE MI REVASCULARIZATION
Anesthesia: LOCAL

## 2017-04-05 MED ORDER — HEPARIN SODIUM (PORCINE) 5000 UNIT/ML IJ SOLN: 5000.0000 [IU] | Freq: Three times a day (TID) | INTRAMUSCULAR | Status: DC

## 2017-04-05 MED ORDER — TICAGRELOR 90 MG PO TABS
90.0000 mg | ORAL_TABLET | Freq: Two times a day (BID) | ORAL | Status: DC
  Administered 2017-04-06 – 2017-04-08 (×5): 90 mg via ORAL
  Filled 2017-04-05 (×5): qty 1

## 2017-04-05 MED ORDER — SODIUM CHLORIDE 0.9% FLUSH
3.0000 mL | Freq: Two times a day (BID) | INTRAVENOUS | Status: DC
  Administered 2017-04-06 – 2017-04-07 (×4): 3 mL via INTRAVENOUS

## 2017-04-05 MED ORDER — IOPAMIDOL (ISOVUE-370) INJECTION 76%
INTRAVENOUS | Status: DC | PRN
  Administered 2017-04-05: 105 mL via INTRA_ARTERIAL

## 2017-04-05 MED ORDER — TICAGRELOR 90 MG PO TABS
ORAL_TABLET | ORAL | Status: AC
  Filled 2017-04-05: qty 2

## 2017-04-05 MED ORDER — METHYLPREDNISOLONE SODIUM SUCC 125 MG IJ SOLR
INTRAMUSCULAR | Status: DC | PRN
  Administered 2017-04-05: 125 mg via INTRAVENOUS

## 2017-04-05 MED ORDER — METHYLPREDNISOLONE SODIUM SUCC 125 MG IJ SOLR
INTRAMUSCULAR | Status: AC
  Filled 2017-04-05: qty 2

## 2017-04-05 MED ORDER — HEPARIN (PORCINE) IN NACL 2-0.9 UNIT/ML-% IJ SOLN
INTRAMUSCULAR | Status: AC | PRN
  Administered 2017-04-05: 1000 mL

## 2017-04-05 MED ORDER — DIPHENHYDRAMINE HCL 50 MG/ML IJ SOLN
INTRAMUSCULAR | Status: AC
  Filled 2017-04-05: qty 1

## 2017-04-05 MED ORDER — ASPIRIN 81 MG PO CHEW: 324.0000 mg | CHEWABLE_TABLET | Freq: Once | ORAL | Status: DC

## 2017-04-05 MED ORDER — ACETAMINOPHEN 325 MG PO TABS: 650.0000 mg | ORAL_TABLET | ORAL | Status: DC | PRN

## 2017-04-05 MED ORDER — ATORVASTATIN CALCIUM 80 MG PO TABS
80.0000 mg | ORAL_TABLET | Freq: Every day | ORAL | Status: DC
  Administered 2017-04-06 – 2017-04-07 (×2): 80 mg via ORAL
  Filled 2017-04-05 (×2): qty 1

## 2017-04-05 MED ORDER — NITROGLYCERIN 0.4 MG SL SUBL: 0.4000 mg | SUBLINGUAL_TABLET | SUBLINGUAL | Status: DC | PRN

## 2017-04-05 MED ORDER — IRBESARTAN 75 MG PO TABS
75.0000 mg | ORAL_TABLET | Freq: Every day | ORAL | Status: DC
  Filled 2017-04-05: qty 1

## 2017-04-05 MED ORDER — HYDROCHLOROTHIAZIDE 12.5 MG PO CAPS: 12.5000 mg | ORAL_CAPSULE | Freq: Every day | ORAL | Status: DC

## 2017-04-05 MED ORDER — ASPIRIN 81 MG PO CHEW
81.0000 mg | CHEWABLE_TABLET | Freq: Every day | ORAL | Status: DC
  Administered 2017-04-06 – 2017-04-08 (×3): 81 mg via ORAL
  Filled 2017-04-05 (×3): qty 1

## 2017-04-05 MED ORDER — VERAPAMIL HCL 2.5 MG/ML IV SOLN
INTRAVENOUS | Status: DC | PRN
  Administered 2017-04-05: 10 mL via INTRA_ARTERIAL

## 2017-04-05 MED ORDER — SODIUM CHLORIDE 0.9 % IV SOLN: 250.0000 mL | INTRAVENOUS | Status: DC | PRN

## 2017-04-05 MED ORDER — VERAPAMIL HCL 2.5 MG/ML IV SOLN
INTRAVENOUS | Status: AC
  Filled 2017-04-05: qty 2

## 2017-04-05 MED ORDER — METOPROLOL SUCCINATE ER 25 MG PO TB24
25.0000 mg | ORAL_TABLET | Freq: Every day | ORAL | Status: DC
  Administered 2017-04-05 – 2017-04-08 (×4): 25 mg via ORAL
  Filled 2017-04-05 (×4): qty 1

## 2017-04-05 MED ORDER — HEPARIN (PORCINE) IN NACL 2-0.9 UNIT/ML-% IJ SOLN
INTRAMUSCULAR | Status: AC
  Filled 2017-04-05: qty 1000

## 2017-04-05 MED ORDER — LIDOCAINE HCL (PF) 1 % IJ SOLN
INTRAMUSCULAR | Status: AC
  Filled 2017-04-05: qty 30

## 2017-04-05 MED ORDER — SODIUM CHLORIDE 0.9 % WEIGHT BASED INFUSION
1.0000 mL/kg/h | INTRAVENOUS | Status: AC
  Administered 2017-04-05: 1 mL/kg/h via INTRAVENOUS

## 2017-04-05 MED ORDER — ONDANSETRON HCL 4 MG/2ML IJ SOLN: 4.0000 mg | Freq: Four times a day (QID) | INTRAMUSCULAR | Status: DC | PRN

## 2017-04-05 MED ORDER — POTASSIUM CHLORIDE CRYS ER 20 MEQ PO TBCR
40.0000 meq | EXTENDED_RELEASE_TABLET | ORAL | Status: AC
  Administered 2017-04-05 – 2017-04-06 (×2): 40 meq via ORAL
  Filled 2017-04-05 (×2): qty 2

## 2017-04-05 MED ORDER — SODIUM CHLORIDE 0.9 % IV SOLN
INTRAVENOUS | Status: AC | PRN
  Administered 2017-04-05: 50 mL/h via INTRAVENOUS

## 2017-04-05 MED ORDER — DIPHENHYDRAMINE HCL 50 MG/ML IJ SOLN
INTRAMUSCULAR | Status: DC | PRN
  Administered 2017-04-05: 25 mg via INTRAVENOUS

## 2017-04-05 MED ORDER — SODIUM CHLORIDE 0.9 % IV SOLN: INTRAVENOUS | Status: DC

## 2017-04-05 MED ORDER — FAMOTIDINE IN NACL 20-0.9 MG/50ML-% IV SOLN
INTRAVENOUS | Status: AC | PRN
  Administered 2017-04-05: 20 mg via INTRAVENOUS

## 2017-04-05 MED ORDER — IOPAMIDOL (ISOVUE-370) INJECTION 76%
INTRAVENOUS | Status: AC
  Filled 2017-04-05: qty 150

## 2017-04-05 MED ORDER — HEPARIN SODIUM (PORCINE) 5000 UNIT/ML IJ SOLN
60.0000 [IU]/kg | Freq: Once | INTRAMUSCULAR | Status: AC
  Administered 2017-04-05: 400 [IU] via INTRAVENOUS

## 2017-04-05 MED ORDER — HEPARIN SODIUM (PORCINE) 5000 UNIT/ML IJ SOLN
5000.0000 [IU] | Freq: Three times a day (TID) | INTRAMUSCULAR | Status: DC
  Administered 2017-04-06 – 2017-04-08 (×7): 5000 [IU] via SUBCUTANEOUS
  Filled 2017-04-05 (×6): qty 1

## 2017-04-05 MED ORDER — LIDOCAINE HCL (PF) 1 % IJ SOLN
INTRAMUSCULAR | Status: DC | PRN
  Administered 2017-04-05: 2 mL

## 2017-04-05 MED ORDER — ORAL CARE MOUTH RINSE: 15.0000 mL | Freq: Two times a day (BID) | OROMUCOSAL | Status: DC

## 2017-04-05 MED ORDER — VALSARTAN-HYDROCHLOROTHIAZIDE 80-12.5 MG PO TABS: 1.0000 | ORAL_TABLET | Freq: Every day | ORAL | Status: DC

## 2017-04-05 MED ORDER — HEPARIN SODIUM (PORCINE) 1000 UNIT/ML IJ SOLN
INTRAMUSCULAR | Status: AC
  Filled 2017-04-05: qty 1

## 2017-04-05 MED ORDER — SODIUM CHLORIDE 0.9% FLUSH: 3.0000 mL | INTRAVENOUS | Status: DC | PRN

## 2017-04-05 MED ORDER — TICAGRELOR 90 MG PO TABS
ORAL_TABLET | ORAL | Status: DC | PRN
Start: 2017-04-05 — End: 2017-04-05
  Administered 2017-04-05: 180 mg via ORAL

## 2017-04-05 MED ORDER — FAMOTIDINE IN NACL 20-0.9 MG/50ML-% IV SOLN
INTRAVENOUS | Status: AC
  Filled 2017-04-05: qty 50

## 2017-04-05 SURGICAL SUPPLY — 17 items
BALLN SAPPHIRE 2.5X15 (BALLOONS) ×2
BALLN SAPPHIRE ~~LOC~~ 3.5X12 (BALLOONS) ×1 IMPLANT
BALLOON SAPPHIRE 2.5X15 (BALLOONS) IMPLANT
CATH 5FR JL3.5 JR4 ANG PIG MP (CATHETERS) ×1 IMPLANT
CATH VISTA GUIDE 6FR XBLAD3.5 (CATHETERS) ×1 IMPLANT
DEVICE RAD COMP TR BAND LRG (VASCULAR PRODUCTS) ×1 IMPLANT
GLIDESHEATH SLEND SS 6F .021 (SHEATH) ×1 IMPLANT
GUIDEWIRE INQWIRE 1.5J.035X260 (WIRE) IMPLANT
INQWIRE 1.5J .035X260CM (WIRE) ×2
KIT ENCORE 26 ADVANTAGE (KITS) ×2 IMPLANT
KIT HEART LEFT (KITS) ×2 IMPLANT
PACK CARDIAC CATHETERIZATION (CUSTOM PROCEDURE TRAY) ×2 IMPLANT
STENT SYNERGY DES 3X20 (Permanent Stent) ×1 IMPLANT
SYR MEDRAD MARK V 150ML (SYRINGE) ×2 IMPLANT
TRANSDUCER W/STOPCOCK (MISCELLANEOUS) ×2 IMPLANT
TUBING CIL FLEX 10 FLL-RA (TUBING) ×2 IMPLANT
WIRE ASAHI PROWATER 180CM (WIRE) ×1 IMPLANT

## 2017-04-05 NOTE — H&P (Signed)
Cardiology Admission History and Physical:   Patient ID: Allison Sanders; MRN: [id number]; DOB: January 08, 1951   Admission date: 04/05/2017  Primary Care Provider: Seward Carol, MD Primary Cardiologist: No primary care provider on file. new   Chief Complaint: chest pain  Patient Profile:   Allison Sanders is a 67 y.o. female with a history of HTN presents with acute anterior STEMI  History of Present Illness:   Ms. Allison Sanders has a history of HTN. She has been in good health with no prior cardiac history. Tonight at about 7 pm she developed bad indigestion after eating dinner. The pain intensified and radiated down both arms and into her jaw. It was 9/10. EMS was called and Ecg showed acute ST elevation in the anterior leads. Code STEMI was activated.    Past Medical History:  Diagnosis Date  . HTN (hypertension)   . Kidney stone     Past Surgical History:  Procedure Laterality Date  . DILATION AND CURETTAGE, DIAGNOSTIC / THERAPEUTIC    . left nephrectomy       Medications Prior to Admission: Prior to Admission medications   Medication Sig Start Date End Date Taking? Authorizing Provider  cholecalciferol (VITAMIN D) 1000 UNITS tablet Take 2,000 Units by mouth daily.    [provider]  Misc Natural Products (TART CHERRY ADVANCED PO) Take by mouth.    [provider]  Turmeric 500 MG CAPS Take 1 capsule by mouth daily.    [provider]  valsartan-hydrochlorothiazide (DIOVAN-HCT) 80-12.5 MG per tablet Take 1 tablet by mouth daily. 10/16/14   [provider]     Allergies:    Allergies  Allergen Reactions  . Contrast Media [Iodinated Diagnostic Agents]   . Penicillins   . Sulfa Antibiotics     Social History:   Social History   Socioeconomic History  . Marital status: Single    Spouse name: Not on file  . Number of children: 2  . Years of education: Not on file  . Highest education level: Not on file  Social Needs  .  Financial resource strain: Not on file  . Food insecurity - worry: Not on file  . Food insecurity - inability: Not on file  . Transportation needs - medical: Not on file  . Transportation needs - non-medical: Not on file  Occupational History  . Not on file  Tobacco Use  . Smoking status: Never Smoker  . Smokeless tobacco: Never Used  Substance and Sexual Activity  . Alcohol use: No    Alcohol/week: 0.0 oz    Frequency: Never  . Drug use: Not on file  . Sexual activity: Not on file  Other Topics Concern  . Not on file  Social History Narrative  . Not on file    Family History:   The patient's family history includes Heart attack in her father.    ROS:  Please see the history of present illness.  All other ROS reviewed and negative.     Physical Exam/Data:   Vitals:   04/05/17 2057 04/05/17 2102 04/05/17 2107 04/05/17 2111  BP: 133/74 138/71 (!) 141/78   Pulse: 97 (!) 102 (!) 117 (!) 103  Resp: 15 10 20    Temp:      TempSrc:      SpO2: 97% 100% 100%   Weight:      Height:       No intake or output data in the 24 hours ending 04/05/17 2127 [employer]  04/05/17 2023  Weight: 190 lb (86.2 kg)   Body mass index is 31.62 kg/m.  General:  Well nourished, overweight, in no acute distress HEENT: normal Lymph: no adenopathy Neck: no JVD Endocrine:  No thryomegaly Vascular: No carotid bruits; FA pulses 2+ bilaterally without bruits  Cardiac:  normal S1, S2; RRR; no murmur  Lungs:  clear to auscultation bilaterally, no wheezing, rhonchi or rales  Abd: soft, nontender, no hepatomegaly  Ext: no edema Musculoskeletal:  No deformities, BUE and BLE strength normal and equal Skin: warm and dry  Neuro:  CNs 2-12 intact, no focal abnormalities noted Psych:  Normal affect    EKG:  The ECG that was done  was personally reviewed and demonstrates NSR with acute ST elevation in the anterior precordial leads. Reciprocal ST depression in the inferior leads. I have personally  reviewed and interpreted this study.   Relevant CV Studies: none  Laboratory Data:  ChemistryNo results for input(s): NA, K, CL, CO2, GLUCOSE, BUN, CREATININE, CALCIUM, GFRNONAA, GFRAA, ANIONGAP in the last 168 hours.  No results for input(s): PROT, ALBUMIN, AST, ALT, ALKPHOS, BILITOT in the last 168 hours. HematologyNo results for input(s): WBC, RBC, HGB, HCT, MCV, MCH, MCHC, RDW, PLT in the last 168 hours. Cardiac EnzymesNo results for input(s): TROPONINI in the last 168 hours. No results for input(s): TROPIPOC in the last 168 hours.  BNPNo results for input(s): BNP, PROBNP in the last 168 hours.  DDimer No results for input(s): DDIMER in the last 168 hours.  Radiology/Studies:  No results found.  Assessment and Plan:   1. Acute anterior STEMI. Given ASA en route. IV heparin bolus in the ED. Will transport directly to the cardiac cath lab for emergent cardiac cath and PCI. Will pretreat for contrast allergy. 2. HTN controlled.   Severity of Illness: The appropriate patient status for this patient is INPATIENT. Inpatient status is judged to be reasonable and necessary in order to provide the required intensity of service to ensure the patient's safety. The patient's presenting symptoms, physical exam findings, and initial radiographic and laboratory data in the context of their chronic comorbidities is felt to place them at high risk for further clinical deterioration. Furthermore, it is not anticipated that the patient will be medically stable for discharge from the hospital within 2 midnights of admission. The following factors support the patient status of inpatient.   " The patient's presenting symptoms include severe chest pain. " The worrisome physical exam findings include none. " The initial radiographic and laboratory data are worrisome because of ST elevation on Ecg. " The chronic co-morbidities include HTN.   * I certify that at the point of admission it is my clinical  judgment that the patient will require inpatient hospital care spanning beyond 2 midnights from the point of admission due to high intensity of service, high risk for further deterioration and high frequency of surveillance required.*    For questions or updates, please contact [place] Please consult [email or web address] for contact info under Cardiology/STEMI.    Signed, Peter Martinique, MD  04/05/2017 9:27 PM

## 2017-04-05 NOTE — ED Provider Notes (Signed)
[facility] DEPART[facility] Provider Note   CSN: [id number] Arrival date & time: 04/05/17  2010     History   Chief Complaint Chief Complaint  Patient presents with  . Code STEMI    HPI Allison Sanders is a 67 y.o. female.  HPI  67 year old female history of hypertension presents today with chest pain and ST elevation MI on prehospital EKG.  Patient states that she ate dinner tonight began having some epigastric deep discomfort that she thought was indigestion at about 630.  She states that then worsened and radiated down both of her arms.  At the worst it was an 8 out of 10.  She did not have associated symptoms.  EMS was called and gave her aspirin and 2 sublingual nitros.  This improved the pain and is currently at a 3 out of 10.  She has not had any similar similar events in the past and has no known cardiac history.  No past medical history on file.  Patient Active Problem List   Diagnosis Date Noted  . Carpal tunnel syndrome, bilateral upper limbs 10/20/2016  . Degenerative disc disease, lumbar 09/04/2015  . Low back pain 08/14/2015  . Arthritis of left lower extremity 11/19/2014  . Arthritis of right lower extremity 11/19/2014    No past surgical history on file.  OB History    No data available       Home Medications    Prior to Admission medications   Medication Sig Start Date End Date Taking? Authorizing Provider  cholecalciferol (VITAMIN D) 1000 UNITS tablet Take 2,000 Units by mouth daily.    [provider]  Misc Natural Products (TART CHERRY ADVANCED PO) Take by mouth.    [provider]  Turmeric 500 MG CAPS Take 1 capsule by mouth daily.    [provider]  valsartan-hydrochlorothiazide (DIOVAN-HCT) 80-12.5 MG per tablet Take 1 tablet by mouth daily. 10/16/14   [provider]    Family History No family history on file.  Social History Social History   Tobacco Use  . Smoking  status: Never Smoker  Substance Use Topics  . Alcohol use: Not on file  . Drug use: Not on file     Allergies   Contrast media [iodinated diagnostic agents]; Penicillins; and Sulfa antibiotics   Review of Systems Review of Systems  All other systems reviewed and are negative.    Physical Exam Updated Vital Signs There were no vitals taken for this visit.  Physical Exam  Constitutional: She is oriented to person, place, and time. She appears well-developed and well-nourished.  HENT:  Head: Normocephalic and atraumatic.  Right Ear: External ear normal.  Left Ear: External ear normal.  Nose: Nose normal.  Mouth/Throat: Oropharynx is clear and moist.  Eyes: Pupils are equal, round, and reactive to light.  Neck: Normal range of motion.  Cardiovascular: Normal rate and regular rhythm.  Pulmonary/Chest: Effort normal and breath sounds normal.  Abdominal: Soft. Bowel sounds are normal.  Musculoskeletal: Normal range of motion.  Neurological: She is alert and oriented to person, place, and time.  Skin: Skin is warm. Capillary refill takes less than 2 seconds.  Psychiatric: She has a normal mood and affect.  Nursing note and vitals reviewed.    ED Treatments / Results  Labs (all labs ordered are listed, but only abnormal results are displayed) Labs Reviewed  CBC WITH DIFFERENTIAL/PLATELET  PROTIME-INR  APTT  COMPREHENSIVE METABOLIC PANEL  TROPONIN I  LIPID  PANEL    EKG  EKG Interpretation  Date/Time:  Monday April 05 2017 20:16:08 EST Ventricular Rate:  117 PR Interval:    QRS Duration: 92 QT Interval:  342 QTC Calculation: 478 R Axis:   88 Text Interpretation:  Sinus tachycardia st elevation v1 and v2 with reciprocity in inferior and lateral leads ** ** ACUTE MI / STEMI ** ** Confirmed by Pattricia Boss [id number]) on 04/05/2017 8:23:47 PM       Radiology No results found.  Procedures Procedures (including critical care time)  Medications Ordered in  ED Medications  0.9 %  sodium chloride infusion (not administered)  aspirin chewable tablet 324 mg (324 mg Oral Not Given 04/05/17 2020)  heparin injection 60 Units/kg (400 Units Intravenous Given 04/05/17 2019)     Initial Impression / Assessment and Plan / ED Course  I have reviewed the triage vital signs and the nursing notes.  Pertinent labs & imaging results that were available during my care of the patient were reviewed by me and considered in my medical decision making (see chart for details).     Vitals:   04/05/17 2023  BP: 136/87  Pulse: (!) 110  Resp: 18  Temp: 98.1 F (36.7 C)  SpO2: 100%     STEMI order set initiated.  Dr. Martinique is here at bedside.  Cath Lab is ready for patient. CRITICAL CARE Performed by: Pattricia Boss Total critical care time: 20 minutes Critical care time was exclusive of separately billable procedures and treating other patients. Critical care was necessary to treat or prevent imminent or life-threatening deterioration. Critical care was time spent personally by me on the following activities: development of treatment plan with patient and/or surrogate as well as nursing, discussions with consultants, evaluation of patient's response to treatment, examination of patient, obtaining history from patient or surrogate, ordering and performing treatments and interventions, ordering and review of laboratory studies, ordering and review of radiographic studies, pulse oximetry and re-evaluation of patient's condition.  Final Clinical Impressions(s) / ED Diagnoses   Final diagnoses:  ST elevation myocardial infarction (STEMI), unspecified artery [facility])    ED Discharge Orders    None       Pattricia Boss, MD 04/05/17 2025

## 2017-04-05 NOTE — ED Triage Notes (Signed)
BIB EMS from home, CP present since 6P, started out as discomfort 8/10 now chest pressure. Received 324 ASA and 2NTG, pain down to 3/10. Code STEMI called en route by EMS, elevation and reciprocal changes. 22L H

## 2017-04-05 NOTE — ED Notes (Signed)
Pt taken to Cath Lab with Harvin Hazel and Cardiology. Stable

## 2017-04-06 ENCOUNTER — Inpatient Hospital Stay (HOSPITAL_COMMUNITY): Payer: Medicare HMO

## 2017-04-06 ENCOUNTER — Encounter (HOSPITAL_COMMUNITY): Payer: Self-pay | Admitting: Cardiology

## 2017-04-06 ENCOUNTER — Other Ambulatory Visit (HOSPITAL_COMMUNITY): Payer: Medicare HMO

## 2017-04-06 DIAGNOSIS — I10 Essential (primary) hypertension: Secondary | ICD-10-CM

## 2017-04-06 DIAGNOSIS — I2102 ST elevation (STEMI) myocardial infarction involving left anterior descending coronary artery: Secondary | ICD-10-CM

## 2017-04-06 DIAGNOSIS — I2510 Atherosclerotic heart disease of native coronary artery without angina pectoris: Secondary | ICD-10-CM

## 2017-04-06 LAB — BASIC METABOLIC PANEL
Anion gap: 14 (ref 5–15)
BUN: 17 mg/dL (ref 6–20)
CALCIUM: 9.3 mg/dL (ref 8.9–10.3)
CO2: 20 mmol/L — AB (ref 22–32)
Chloride: 104 mmol/L (ref 101–111)
Creatinine, Ser: 1.07 mg/dL — ABNORMAL HIGH (ref 0.44–1.00)
GFR calc Af Amer: >60 mL/min (ref >60)
GFR, EST NON AFRICAN AMERICAN: 52 mL/min — AB (ref >60)
GLUCOSE: 195 mg/dL — AB (ref 65–99)
POTASSIUM: 4.3 mmol/L (ref 3.5–5.1)
Sodium: 138 mmol/L (ref 135–145)

## 2017-04-06 LAB — CBC
HEMATOCRIT: 42.7 % (ref 36.0–46.0)
Hemoglobin: 14.8 g/dL (ref 12.0–15.0)
MCH: 31.6 pg (ref 26.0–34.0)
MCHC: 34.7 g/dL (ref 30.0–36.0)
MCV: 91 fL (ref 78.0–100.0)
Platelets: 210 10*3/uL (ref 150–400)
RBC: 4.69 MIL/uL (ref 3.87–5.11)
RDW: 12.2 % (ref 11.5–15.5)
WBC: 12.3 10*3/uL — ABNORMAL HIGH (ref 4.0–10.5)

## 2017-04-06 LAB — POCT I-STAT, CHEM 8
BUN: 18 mg/dL (ref 6–20)
CALCIUM ION: 1.25 mmol/L (ref 1.15–1.40)
CHLORIDE: 100 mmol/L — AB (ref 101–111)
Creatinine, Ser: 0.9 mg/dL (ref 0.44–1.00)
Glucose, Bld: 199 mg/dL — ABNORMAL HIGH (ref 65–99)
HCT: 44 % (ref 36.0–46.0)
Hemoglobin: 15 g/dL (ref 12.0–15.0)
Potassium: 2.9 mmol/L — ABNORMAL LOW (ref 3.5–5.1)
SODIUM: 140 mmol/L (ref 135–145)
TCO2: 24 mmol/L (ref 22–32)

## 2017-04-06 LAB — TROPONIN I
TROPONIN I: 4.75 ng/mL — AB (ref <0.03)
Troponin I: 2.73 ng/mL (ref <0.03)

## 2017-04-06 LAB — MRSA PCR SCREENING: MRSA by PCR: NEGATIVE

## 2017-04-06 LAB — POCT ACTIVATED CLOTTING TIME: ACTIVATED CLOTTING TIME: 472 s

## 2017-04-06 MED ORDER — IRBESARTAN 75 MG PO TABS
75.0000 mg | ORAL_TABLET | Freq: Every day | ORAL | Status: DC
  Administered 2017-04-06 – 2017-04-08 (×3): 75 mg via ORAL
  Filled 2017-04-06 (×3): qty 1

## 2017-04-06 MED ORDER — HYDROCHLOROTHIAZIDE 12.5 MG PO CAPS: 12.5000 mg | ORAL_CAPSULE | Freq: Every day | ORAL | Status: DC

## 2017-04-06 MED FILL — Heparin Sodium (Porcine) 2 Unit/ML in Sodium Chloride 0.9%: INTRAMUSCULAR | Qty: 1000 | Status: AC

## 2017-04-06 NOTE — Plan of Care (Signed)
  Progressing Elimination: Will not experience complications related to urinary retention 04/06/2017 0841 - Progressing by Netta Corrigan, RN Note Pt is making adequate urine output Cardiovascular: Vascular access site(s) Level 0-1 will be maintained 04/06/2017 0841 - Progressing by Netta Corrigan, RN Note Right radial site has maintained a level one with minimal bruising.

## 2017-04-06 NOTE — Progress Notes (Signed)
Progress Note  Patient Name: Allison Sanders Date of Encounter: 04/06/2017  Primary Cardiologist: No primary care provider on file.   Subjective   States she is chest pain free after stent placement.  States that yesterday was the first episode of chest pain she has ever experienced.    Inpatient Medications    Scheduled Meds: . aspirin  324 mg Oral Once  . aspirin  81 mg Oral Daily  . atorvastatin  80 mg Oral q1800  . heparin  5,000 Units Subcutaneous Q8H  . [START ON 04/07/2017] hydrochlorothiazide  12.5 mg Oral Daily   And  . irbesartan  75 mg Oral Daily  . mouth rinse  15 mL Mouth Rinse BID  . metoprolol succinate  25 mg Oral Daily  . sodium chloride flush  3 mL Intravenous Q12H  . ticagrelor  90 mg Oral BID   Continuous Infusions: . sodium chloride    . sodium chloride     PRN Meds: sodium chloride, acetaminophen, nitroGLYCERIN, ondansetron (ZOFRAN) IV, sodium chloride flush   Vital Signs    Vitals:   04/06/17 0600 04/06/17 0700 04/06/17 0744 04/06/17 0800  BP: 117/68 117/71  132/81  Pulse:      Resp: 14 12  16   Temp:   98.3 F (36.8 C)   TempSrc:   Oral   SpO2: 98% 99%  99%  Weight:      Height:        Intake/Output Summary (Last 24 hours) at 04/06/2017 0855 Last data filed at 04/06/2017 0800 Gross per 24 hour  Intake 862 ml  Output 400 ml  Net 462 ml   Filed Weights   04/05/17 2023 04/05/17 2130  Weight: 86.2 kg (190 lb) 85.8 kg (189 lb 2.5 oz)    Telemetry    NSR with PVCs, non-sustained vtach- Personally Reviewed  ECG    NSR with t wave abnormalities - Personally Reviewed  Physical Exam   GEN: No acute distress.   Neck: No JVD Cardiac: RRR, no murmurs, rubs, or gallops.  Respiratory: Clear to auscultation bilaterally. GI: Soft, nontender, non-distended  MS: No edema; No deformity. Neuro:  Nonfocal  Psych: Normal affect   Labs    Chemistry Recent Labs  Lab 04/05/17 2044 04/06/17 0155  NA 138 138  K 2.9* 4.3  CL 102  104  CO2 21* 20*  GLUCOSE 193* 195*  BUN 18 17  CREATININE 1.10* 1.07*  CALCIUM 9.6 9.3  PROT 6.6  --   ALBUMIN 3.7  --   AST 29  --   ALT 19  --   ALKPHOS 55  --   BILITOT 0.8  --   GFRNONAA 51* 52*  GFRAA 59* >60  ANIONGAP 15 14     Hematology Recent Labs  Lab 04/05/17 2044 04/06/17 0155  WBC 11.5* 12.3*  RBC 4.51 4.69  HGB 14.3 14.8  HCT 40.6 42.7  MCV 90.0 91.0  MCH 31.7 31.6  MCHC 35.2 34.7  RDW 12.4 12.2  PLT 226 210    Cardiac Enzymes Recent Labs  Lab 04/05/17 2044 04/06/17 0549  TROPONINI 0.11* 2.73*   No results for input(s): TROPIPOC in the last 168 hours.   BNPNo results for input(s): BNP, PROBNP in the last 168 hours.   DDimer No results for input(s): DDIMER in the last 168 hours.   Radiology    No results found.  Cardiac Studies   Left heart cath 2/12  Prox LAD lesion is 100% stenosed.  A drug-eluting stent was successfully placed using a STENT SYNERGY DES 3X20.  Post intervention, there is a 0% residual stenosis.  Ost 1st Diag lesion is 100% stenosed.  Prox RCA to Mid RCA lesion is 25% stenosed.  Prox Cx lesion is 30% stenosed.  The left ventricular systolic function is normal.  LV end diastolic pressure is normal.  The left ventricular ejection fraction is 50-55% by visual estimate.   1. Single vessel occlusive CAD 2. Mild LV dysfunction with apical wall motion abnormality. 3. Normal LVEDP 4. Successful PCI and stenting of the mid LAD with DES  Patient Profile     67 y.o. female with history of HTN that presents to the ED with acute anterior STEMI  Assessment & Plan    1. Acute anterior STEMI s/p DES to proximal LAD Post STEMI day 0 and denies chest pain.  Troponin 0.11>>2.73.  Continuing to trend troponins.  Hemodynamically stable.  Started on DAPT with aspirin and brilinta.  Will need 12 months of uninterrupted DAPT.  Also started on metoprolol 25mg  daily and high intensity statin.  Hgb A1C 5.7.  2. Hypertension At  home on valsartan-hctz.  Currently normotensive.  Continuing blood pressure medications.  3. Hypokalemia On admission K was 2.9, this was repleated and currently 4.3.  Checking Mag  For questions or updates, please contact [place] Please consult [email or web address] for contact info under Cardiology/STEMI.      Signed, Boyd Kerbs, DO  04/06/2017, 8:55 AM    Agree with note by Dr. Kalman Shan  Postop day 0 anterior STEMI treated with PCI and drug-eluting stenting using a synergy drug-eluting stent by Dr. Martinique of her proximal LAD. First diagonal branch was also occluded. She had apical dyskinesia. Her troponins are mildly elevated. Her EKG has improved. She she will no longer has chest pain. Her exam is benign. She is on dual antiplatelet therapy. Plan will be to keep her in the unit today with plans on transferring her to telemetry tomorrow to work with cardiac rehabilitation with anticipation of discharge home on Thursday.  Lorretta Harp, M.D., [place], [facility], [place] [address] [place], [place]  [address]  [phone] 04/06/2017 10:04 AM

## 2017-04-06 NOTE — Progress Notes (Signed)
  Echocardiogram 2D Echocardiogram has been performed.  Johny Chess 04/06/2017, 5:50 PM

## 2017-04-06 NOTE — Progress Notes (Signed)
CARDIAC REHAB PHASE I   PRE:  Rate/Rhythm: 80 SR  BP:  Supine: 167/82  Sitting:   Standing:    SaO2: 99 RA  MODE:  Ambulation: 370 ft   POST:  Rate/Rhythm: 92 SR  BP:  Supine:   Sitting: 168/86  Standing:    SaO2: 97 RA 1335-1445  Pt tolerated ambulation well without c/o of cp or SOB. She states that her legs feel a little weak. Pt to recliner after walk with call light in reach and daughter present. Started MI and stent education. Discussed MI, risk factors, modifications, Brilinta and compliance,Outpt. CRP and activity restrictions. Pt voices understanding. Will send referral to Outpt. CRP in [place]. We will follow pt tomorrow to continue ambulation and education.  Rodney Langton RN 04/06/2017 2:35 PM

## 2017-04-07 LAB — BASIC METABOLIC PANEL
Anion gap: 10 (ref 5–15)
BUN: 20 mg/dL (ref 6–20)
CALCIUM: 9.2 mg/dL (ref 8.9–10.3)
CO2: 22 mmol/L (ref 22–32)
CREATININE: 1 mg/dL (ref 0.44–1.00)
Chloride: 107 mmol/L (ref 101–111)
GFR calc Af Amer: >60 mL/min (ref >60)
GFR calc non Af Amer: 57 mL/min — ABNORMAL LOW (ref >60)
GLUCOSE: 116 mg/dL — AB (ref 65–99)
POTASSIUM: 4.4 mmol/L (ref 3.5–5.1)
SODIUM: 139 mmol/L (ref 135–145)

## 2017-04-07 LAB — ECHOCARDIOGRAM COMPLETE
HEIGHTINCHES: 65 in
Weight: 3026.47 oz

## 2017-04-07 LAB — MAGNESIUM: MAGNESIUM: 2 mg/dL (ref 1.7–2.4)

## 2017-04-07 NOTE — Progress Notes (Signed)
2.  S/W LENISE @ [employer] [facility] # [phone] OPT- 2   BRILINTA 90 MG BID   COVER- YES  CO-PAY- $ 47.00  TIER- 3 DRUG  PRIOR APPROVAL- NO   PREFERRED PHARMACY : [facility]

## 2017-04-07 NOTE — Progress Notes (Signed)
CARDIAC REHAB PHASE I   PRE:  Rate/Rhythm: 78 SR  BP:  Supine: 124/63  Sitting:   Standing:    SaO2: 100%RA  MODE:  Ambulation: 400 ft   POST:  Rate/Rhythm: 94 SR  BP:  Supine:   Sitting: 136/71  Standing:    SaO2: 97%RA 1115-1150 Pt walked 400 ft on RA with steady gait and no CP. Tolerated well. Reviewed NTG use, healthy food choices and ex ed. Encouraged pt to attend CRP 2 for ex but also for the ed as she has lots of questions. Answered questions and emotional support given. She is concerned about getting back to caring for her son.   Graylon Good, RN BSN  04/07/2017 11:47 AM

## 2017-04-07 NOTE — Care Management Note (Addendum)
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  She is s/p coronary stent intervention, will be on brilinta.  NCM gave patient the 30 day savings coupon.  NCM awaiting benefit check for brilinta.  She has PCP . [facility] [address], [facility] do [facility] in stock.  NCM informed patient of the co pay of 47.00 for refills.                 Action/Plan: NCM will follow for dc needs.   Expected Discharge Date:                  Expected Discharge Plan:  Home/Self Care  In-House Referral:     Discharge planning Services  CM Consult, Medication Assistance  Post Acute Care Choice:    Choice offered to:     DME Arranged:    DME Agency:     HH Arranged:    HH Agency:     Status of Service:  Completed, signed off  If discussed at [employer] of Stay Meetings, dates discussed:    Additional Comments:  Zenon Mayo, RN 04/07/2017, 11:02 AM

## 2017-04-07 NOTE — Progress Notes (Signed)
Progress Note  Patient Name: Allison Sanders Date of Encounter: 04/07/2017  Primary Cardiologist: No primary care provider on file.   Subjective   Continues to be chest pain free after stent placment  Inpatient Medications    Scheduled Meds: . aspirin  324 mg Oral Once  . aspirin  81 mg Oral Daily  . atorvastatin  80 mg Oral q1800  . heparin  5,000 Units Subcutaneous Q8H  . hydrochlorothiazide  12.5 mg Oral Daily   And  . irbesartan  75 mg Oral Daily  . metoprolol succinate  25 mg Oral Daily  . sodium chloride flush  3 mL Intravenous Q12H  . ticagrelor  90 mg Oral BID   Continuous Infusions: . sodium chloride    . sodium chloride     PRN Meds: sodium chloride, acetaminophen, nitroGLYCERIN, ondansetron (ZOFRAN) IV, sodium chloride flush   Vital Signs    Vitals:   04/07/17 0300 04/07/17 0400 04/07/17 0412 04/07/17 0500  BP: (!) 105/55 (!) 97/46  (!) 87/44  Pulse:      Resp: 15 12  12   Temp:   98.4 F (36.9 C)   TempSrc:   Oral   SpO2: 96% 95%  96%  Weight:      Height:        Intake/Output Summary (Last 24 hours) at 04/07/2017 0548 Last data filed at 04/06/2017 2200 Gross per 24 hour  Intake 577.05 ml  Output 200 ml  Net 377.05 ml   Filed Weights   04/05/17 2023 04/05/17 2130  Weight: 86.2 kg (190 lb) 85.8 kg (189 lb 2.5 oz)    Telemetry    PVCs- Personally Reviewed  ECG    NSR with t wave abnormalities - Personally Reviewed  Physical Exam   GEN: No acute distress.   Neck: No JVD Cardiac: RRR, no murmurs, rubs, or gallops.  Respiratory: Clear to auscultation bilaterally. GI: Soft, nontender, non-distended  MS: No edema; No deformity. Neuro:  Nonfocal  Psych: Normal affect   Labs    Chemistry Recent Labs  Lab 04/05/17 2044 04/06/17 0155 04/07/17 0203  NA 138 138 139  K 2.9* 4.3 4.4  CL 102 104 107  CO2 21* 20* 22  GLUCOSE 193* 195* 116*  BUN 18 17 20   CREATININE 1.10* 1.07* 1.00  CALCIUM 9.6 9.3 9.2  PROT 6.6  --   --     ALBUMIN 3.7  --   --   AST 29  --   --   ALT 19  --   --   ALKPHOS 55  --   --   BILITOT 0.8  --   --   GFRNONAA 51* 52* 57*  GFRAA 59* >60 >60  ANIONGAP 15 14 10      Hematology Recent Labs  Lab 04/05/17 2040 04/05/17 2044 04/06/17 0155  WBC  --  11.5* 12.3*  RBC  --  4.51 4.69  HGB 15.0 14.3 14.8  HCT 44.0 40.6 42.7  MCV  --  90.0 91.0  MCH  --  31.7 31.6  MCHC  --  35.2 34.7  RDW  --  12.4 12.2  PLT  --  226 210    Cardiac Enzymes Recent Labs  Lab 04/05/17 2044 04/06/17 0549 04/06/17 0912  TROPONINI 0.11* 2.73* 4.75*   No results for input(s): TROPIPOC in the last 168 hours.   BNPNo results for input(s): BNP, PROBNP in the last 168 hours.   DDimer No results for input(s): DDIMER in  the last 168 hours.   Radiology    No results found.  Cardiac Studies   Left heart cath 2/12  Prox LAD lesion is 100% stenosed.  A drug-eluting stent was successfully placed using a STENT SYNERGY DES 3X20.  Post intervention, there is a 0% residual stenosis.  Ost 1st Diag lesion is 100% stenosed.  Prox RCA to Mid RCA lesion is 25% stenosed.  Prox Cx lesion is 30% stenosed.  The left ventricular systolic function is normal.  LV end diastolic pressure is normal.  The left ventricular ejection fraction is 50-55% by visual estimate.   1. Single vessel occlusive CAD 2. Mild LV dysfunction with apical wall motion abnormality. 3. Normal LVEDP 4. Successful PCI and stenting of the mid LAD with DES  Patient Profile     67 y.o. female with history of HTN that presents to the ED with acute anterior STEMI  Assessment & Plan    1. Acute anterior STEMI s/p DES to proximal LAD Post STEMI day 1 and denies chest pain.  Troponin 0.11>>2.73>>4.7.  On DAPT with aspirin and brilinta.  Will need 12 months of uninterrupted DAPT.  On metoprolol 25mg  daily and high intensity statin.  Hgb A1C 5.7.  Echo read pending.  2. Hypertension At home on valsartan-hctz.  Normotensive with  soft blood pressures at times.  Will hold HCTZ and continue the ARB.  3. Hypokalemia Resolved.  K currently 4.4 and mag 2.0   For questions or updates, please contact [place] Please consult [email or web address] for contact info under Cardiology/STEMI.      Signed, Boyd Kerbs, DO  04/07/2017, 5:48 AM    Agree with noted by Dr. Kalman Shan  Postop day one anterior STEMI treated with PCI and drug-eluting stenting with peak troponin of approximately 5. She is hemodynamically stable. She is on appropriate medications. Her telemetry is benign. She denies chest pain. I'm going to transfer her to telemetry today with anticipation of discharge home tomorrow.  Lorretta Harp, M.D., [place], [facility], Laverta [place] [address] [place], [place]  [address]  [phone] 04/07/2017 10:08 AM

## 2017-04-08 LAB — BASIC METABOLIC PANEL
ANION GAP: 10 (ref 5–15)
BUN: 21 mg/dL — ABNORMAL HIGH (ref 6–20)
CALCIUM: 8.8 mg/dL — AB (ref 8.9–10.3)
CO2: 22 mmol/L (ref 22–32)
Chloride: 106 mmol/L (ref 101–111)
Creatinine, Ser: 1.02 mg/dL — ABNORMAL HIGH (ref 0.44–1.00)
GFR, EST NON AFRICAN AMERICAN: 56 mL/min — AB (ref >60)
Glucose, Bld: 92 mg/dL (ref 65–99)
Potassium: 4 mmol/L (ref 3.5–5.1)
Sodium: 138 mmol/L (ref 135–145)

## 2017-04-08 MED ORDER — NITROGLYCERIN 0.4 MG SL SUBL: 0.4000 mg | SUBLINGUAL_TABLET | SUBLINGUAL | 2 refills | Status: DC | PRN

## 2017-04-08 MED ORDER — ASPIRIN 81 MG PO CHEW: 81.0000 mg | CHEWABLE_TABLET | Freq: Every day | ORAL | 2 refills | Status: AC

## 2017-04-08 MED ORDER — METOPROLOL SUCCINATE ER 25 MG PO TB24: 25.0000 mg | ORAL_TABLET | Freq: Every day | ORAL | 2 refills | Status: DC

## 2017-04-08 MED ORDER — SPIRONOLACTONE 25 MG PO TABS: 12.5000 mg | ORAL_TABLET | Freq: Every day | ORAL | 2 refills | Status: DC

## 2017-04-08 MED ORDER — ATORVASTATIN CALCIUM 80 MG PO TABS: 80.0000 mg | ORAL_TABLET | Freq: Every day | ORAL | 2 refills | Status: DC

## 2017-04-08 MED ORDER — TICAGRELOR 90 MG PO TABS: 90.0000 mg | ORAL_TABLET | Freq: Two times a day (BID) | ORAL | 11 refills | Status: DC

## 2017-04-08 MED ORDER — IRBESARTAN 75 MG PO TABS: 75.0000 mg | ORAL_TABLET | Freq: Every day | ORAL | 2 refills | Status: DC

## 2017-04-08 NOTE — Progress Notes (Signed)
Progress Note  Patient Name: Allison Sanders Date of Encounter: 04/08/2017  Primary Cardiologist: No primary care provider on file.   Subjective   Denies chest pain, shortness of breath or leg swelling during admission.  Inpatient Medications    Scheduled Meds: . aspirin  324 mg Oral Once  . aspirin  81 mg Oral Daily  . atorvastatin  80 mg Oral q1800  . heparin  5,000 Units Subcutaneous Q8H  . irbesartan  75 mg Oral Daily  . metoprolol succinate  25 mg Oral Daily  . sodium chloride flush  3 mL Intravenous Q12H  . ticagrelor  90 mg Oral BID   Continuous Infusions: . sodium chloride    . sodium chloride     PRN Meds: sodium chloride, acetaminophen, nitroGLYCERIN, ondansetron (ZOFRAN) IV, sodium chloride flush   Vital Signs    Vitals:   04/07/17 1106 04/07/17 1357 04/07/17 2133 04/08/17 0535  BP:  132/74 125/70 134/75  Pulse:  61    Resp:      Temp: 98.2 F (36.8 C) 98.2 F (36.8 C) 98.1 F (36.7 C) 98.4 F (36.9 C)  TempSrc: Oral Oral Oral Oral  SpO2:  100%    Weight:      Height:        Intake/Output Summary (Last 24 hours) at 04/08/2017 0559 Last data filed at 04/08/2017 0000 Gross per 24 hour  Intake 1200 ml  Output -  Net 1200 ml   Filed Weights   04/05/17 2023 04/05/17 2130  Weight: 86.2 kg (190 lb) 85.8 kg (189 lb 2.5 oz)    Telemetry    PVCs- Personally Reviewed  ECG    NSR with t wave abnormalities - Personally Reviewed  Physical Exam   GEN: No acute distress.   Neck: No JVD Cardiac: RRR, no murmurs, rubs, or gallops.  Respiratory: Clear to auscultation bilaterally. GI: Soft, nontender, non-distended  MS: No edema; No deformity. Neuro:  Nonfocal  Psych: Normal affect   Labs    Chemistry Recent Labs  Lab 04/05/17 2044 04/06/17 0155 04/07/17 0203  NA 138 138 139  K 2.9* 4.3 4.4  CL 102 104 107  CO2 21* 20* 22  GLUCOSE 193* 195* 116*  BUN 18 17 20   CREATININE 1.10* 1.07* 1.00  CALCIUM 9.6 9.3 9.2  PROT 6.6  --   --    ALBUMIN 3.7  --   --   AST 29  --   --   ALT 19  --   --   ALKPHOS 55  --   --   BILITOT 0.8  --   --   GFRNONAA 51* 52* 57*  GFRAA 59* >60 >60  ANIONGAP 15 14 10      Hematology Recent Labs  Lab 04/05/17 2040 04/05/17 2044 04/06/17 0155  WBC  --  11.5* 12.3*  RBC  --  4.51 4.69  HGB 15.0 14.3 14.8  HCT 44.0 40.6 42.7  MCV  --  90.0 91.0  MCH  --  31.7 31.6  MCHC  --  35.2 34.7  RDW  --  12.4 12.2  PLT  --  226 210    Cardiac Enzymes Recent Labs  Lab 04/05/17 2044 04/06/17 0549 04/06/17 0912  TROPONINI 0.11* 2.73* 4.75*   No results for input(s): TROPIPOC in the last 168 hours.   BNPNo results for input(s): BNP, PROBNP in the last 168 hours.   DDimer No results for input(s): DDIMER in the last 168 hours.  Radiology    No results found.  Cardiac Studies   Left heart cath 2/12  Prox LAD lesion is 100% stenosed.  A drug-eluting stent was successfully placed using a STENT SYNERGY DES 3X20.  Post intervention, there is a 0% residual stenosis.  Ost 1st Diag lesion is 100% stenosed.  Prox RCA to Mid RCA lesion is 25% stenosed.  Prox Cx lesion is 30% stenosed.  The left ventricular systolic function is normal.  LV end diastolic pressure is normal.  The left ventricular ejection fraction is 50-55% by visual estimate.   1. Single vessel occlusive CAD 2. Mild LV dysfunction with apical wall motion abnormality. 3. Normal LVEDP 4. Successful PCI and stenting of the mid LAD with DES  Patient Profile     67 y.o. female with history of HTN that presents to the ED with acute anterior STEMI  Assessment & Plan    1. Acute anterior STEMI s/p DES to proximal LAD Post STEMI day 2 and denies chest pain.  Troponin 0.11>>2.73>>4.7.  On DAPT with aspirin and brilinta.  Will need 12 months of uninterrupted DAPT.  On metoprolol 25mg  daily and high intensity statin.  Hgb A1C 5.7.  LV EF: 35-40%, akinesis of the mid-apicalanteroseptal and apical myocardium, grade 2  diastolic dysfunction, will need repeat echo in 3 months.  Currently on an ARB.    2. Hypertension At home on valsartan-hctz.  Normotensive with soft blood pressures at times.  Will hold HCTZ and continue the ARB at discharge.  Will need follow up to see if she needs additional blood pressure medication.  For questions or updates, please contact [place] Please consult [email or web address] for contact info under Cardiology/STEMI.      Signed, Boyd Kerbs, DO  04/08/2017, 5:59 AM    Agree with the primary Dr Kalman Shan  [name] is postop day 2 anterior STEMI treated with PCI drug-eluting stenting. She is currently stable and ambulatory without limitation. Her EF was 35-40% by 2-D echo. She is on appropriate medications. She is stable for discharge home today. We will recheck a 2-D echocardiogram in 3 months.  Lorretta Harp, M.D., [place], [facility], Laverta [place] [address] [place], [place]  [address]  [phone] 04/08/2017 9:48 AM

## 2017-04-08 NOTE — Progress Notes (Signed)
[id number] Pt walked 470 ft with steady gait and no CP. NSR 76 prior and 81 SR after walk. Gave pt CHF booklet and low sodium diets. Discussed signs and symptoms of CHF since EF low. Encouraged daily weights, 2000 mg sodium restriction. Pt voiced understanding. Graylon Good RN BSN 04/08/2017 12:03 PM

## 2017-04-08 NOTE — Discharge Summary (Signed)
Discharge Summary    Patient ID: Allison STREBEL,  MRN: [id number], DOB/AGE: 05/18/1950 67 y.o.  Admit date: 04/05/2017 Discharge date: 04/08/2017  Primary Care Provider: [place]ard Carol Primary Cardiologist: none, Will follow up with Dr. Martinique  Discharge Diagnoses    Principal Problem:   STEMI involving left anterior descending coronary artery [facility]) Active Problems:   HTN (hypertension)   Acute ST elevation myocardial infarction (STEMI) involving left anterior descending (LAD) coronary artery (HCC)   Allergies Allergies  Allergen Reactions  . Penicillins Other (See Comments)    Childhood allergy, reaction unknown  . Sulfa Antibiotics Other (See Comments)    Childhood allergy, reaction unknown  . Contrast Media [Iodinated Diagnostic Agents] Rash    Pt can tolerate with Diphenhydramine and Steroids    Diagnostic Studies/Procedures    Left Heart cath and coronary angiography 2/14 Conclusion:  Prox LAD lesion is 100% stenosed.  A drug-eluting stent was successfully placed using a STENT SYNERGY DES 3X20.  Post intervention, there is a 0% residual stenosis.  Ost 1st Diag lesion is 100% stenosed.  Prox RCA to Mid RCA lesion is 25% stenosed.  Prox Cx lesion is 30% stenosed.  The left ventricular systolic function is normal.  LV end diastolic pressure is normal.  The left ventricular ejection fraction is 50-55% by visual estimate. _____________   History of Present Illness     Allison Sanders has a history of HTN. She had been in good health with no prior cardiac history. On the night of admission she developed bad indigestion after eating dinner. The pain intensified and radiated down both arms and into her jaw. It was 9/10. EMS was called and Ecg showed acute ST elevation in the anterior leads. Code STEMI was activated.   Hospital Course     Acute anterior STEMI s/p DES to proximal LAD Patient was taken to cath lab and noted to have 100% stenosis of  proximal LAD.  A DES was placed.  Troponin 0.11>>2.73>>4.7.  She was started on DAPT with aspirin and brilinta and will need 12 months of uninterrupted DAPT.  She was also started on metoprolol 25mg  daily and high intensity statin.  Hgb A1C 5.7.  LV EF: 35-40%, akinesis of the mid-apicalanteroseptal and apical myocardium, grade 2 diastolic dysfunction, and will repeat 2-D echo in 3 months. She was continued on her home ARB.  Spironolactone was started at discharge.  She has follow up with Dr. Martinique.    Hypertension At home on valsartan-hctz.  Normotensive with soft blood pressures at times.  Held HCTZ during hospitalization.  ARB was continued.  Blood pressures improved and spironolactone was added on at discharge and HCTZ was discontinued.  Hypokalemia On admission K was 2.9, this was repleated and K was 4 on day of discharge.  Mag was 2.0.  _____________  Discharge Vitals Blood pressure 134/75, pulse 61, temperature 98.4 F (36.9 C), temperature source Oral, resp. rate 17, height 5\' 5"  (1.651 m), weight 84.8 kg (187 lb), SpO2 100 %.  Filed Weights   04/05/17 2023 04/05/17 2130 04/08/17 0535  Weight: 86.2 kg (190 lb) 85.8 kg (189 lb 2.5 oz) 84.8 kg (187 lb)    Labs & Radiologic Studies    CBC Recent Labs    04/05/17 2044 04/06/17 0155  WBC 11.5* 12.3*  HGB 14.3 14.8  HCT 40.6 42.7  MCV 90.0 91.0  PLT 226 003   Basic Metabolic Panel Recent Labs    04/07/17 0203 04/08/17 0534  NA 139 138  K 4.4 4.0  CL 107 106  CO2 22 22  GLUCOSE 116* 92  BUN 20 21*  CREATININE 1.00 1.02*  CALCIUM 9.2 8.8*  MG 2.0  --    Liver Function Tests Recent Labs    04/05/17 2044  AST 29  ALT 19  ALKPHOS 55  BILITOT 0.8  PROT 6.6  ALBUMIN 3.7   No results for input(s): LIPASE, AMYLASE in the last 72 hours. Cardiac Enzymes Recent Labs    04/05/17 2044 04/06/17 0549 04/06/17 0912  TROPONINI 0.11* 2.73* 4.75*   BNP Invalid input(s): POCBNP D-Dimer No results for input(s):  DDIMER in the last 72 hours. Hemoglobin A1C Recent Labs    04/05/17 2044  HGBA1C 5.7*   Fasting Lipid Panel Recent Labs    04/05/17 2044  CHOL 167  HDL 51  LDLCALC 91  TRIG 123  CHOLHDL 3.3   Thyroid Function Tests No results for input(s): TSH, T4TOTAL, T3FREE, THYROIDAB in the last 72 hours.  Invalid input(s): FREET3 _____________  No results found. Disposition   Pt is being discharged home today in good condition.  Follow-up Plans & Appointments    Follow-up Information    Martinique, Peter M, MD Follow up on 04/22/2017.   Specialty:  Cardiology Why:  Feb 28 at [facility] information: [address] [place] [address] [phone]          Discharge Instructions    Amb Referral to Cardiac Rehabilitation   Complete by:  As directed    Diagnosis:   STEMI Coronary Stents     Diet - low sodium heart healthy   Complete by:  As directed    Discharge instructions   Complete by:  As directed    Please follow up with Dr. Martinique on 2/28 at [place] have been started on several new medications.   Please start taking aspirin 81mg  daily and Brilinta 90mg  two times a day.  You will need to be on both of these medications without fail every day for at least one year  Irbesartan 75mg  daily Metoprolol 25mg  daily spironalactone 12.5mg  daily (half a tablet)  Please stop taking tart cherry, tumeric and valsartan-hydrochlorothiazide   Increase activity slowly   Complete by:  As directed       Discharge Medications   Allergies as of 04/08/2017      Reactions   Penicillins Other (See Comments)   Childhood allergy, reaction unknown   Sulfa Antibiotics Other (See Comments)   Childhood allergy, reaction unknown   Contrast Media [iodinated Diagnostic Agents] Rash   Pt can tolerate with Diphenhydramine and Steroids      Medication List    STOP taking these medications   TART CHERRY ADVANCED PO   Turmeric 500 MG Caps     valsartan-hydrochlorothiazide 80-12.5 MG tablet Commonly known as:  DIOVAN-HCT     TAKE these medications   aspirin 81 MG chewable tablet Chew 1 tablet (81 mg total) by mouth daily. Start taking on:  04/09/2017   atorvastatin 80 MG tablet Commonly known as:  LIPITOR Take 1 tablet (80 mg total) by mouth daily at 6 PM.   cholecalciferol 1000 units tablet Commonly known as:  VITAMIN D Take 2,000 Units by mouth daily.   irbesartan 75 MG tablet Commonly known as:  AVAPRO Take 1 tablet (75 mg total) by mouth daily. Start taking on:  04/09/2017   metoprolol succinate 25 MG 24 hr tablet Commonly known as:  TOPROL-XL  Take 1 tablet (25 mg total) by mouth daily. Start taking on:  04/09/2017   nitroGLYCERIN 0.4 MG SL tablet Commonly known as:  NITROSTAT Place 1 tablet (0.4 mg total) under the tongue every 5 (five) minutes x 3 doses as needed for chest pain.   spironolactone 25 MG tablet Commonly known as:  ALDACTONE Take 0.5 tablets (12.5 mg total) by mouth daily.   ticagrelor 90 MG Tabs tablet Commonly known as:  BRILINTA Take 1 tablet (90 mg total) by mouth 2 (two) times daily.   vitamin C 250 MG tablet Commonly known as:  ASCORBIC ACID Take 250 mg by mouth daily.        Aspirin prescribed at discharge?  yes High Intensity Statin Prescribed? yes Beta Blocker Prescribed? yes For EF <40%, was ACEI/ARB Prescribed? yes ADP Receptor Inhibitor Prescribed? Yes For EF <40%, Aldosterone Inhibitor Prescribed? yes Was EF assessed during THIS hospitalization? yes Was Cardiac Rehab II ordered? yes   Outstanding Labs/Studies   BMET at discharge following K  Duration of Discharge Encounter   Greater than 30 minutes including physician time.  Signed, Kalman Shan PGY-2 04/08/2017, 10:52 AM

## 2017-04-12 ENCOUNTER — Telehealth (HOSPITAL_COMMUNITY): Payer: Self-pay

## 2017-04-12 NOTE — Telephone Encounter (Signed)
Called to see if patient was interested in CR - patient is interested. Explained scheduling process and patient stated she understands. Went over insurance with patient - patient understands her responsibility. Will contact patient for scheduling after follow up appt upon review by the RN navigator.

## 2017-04-12 NOTE — Telephone Encounter (Signed)
Patients insurance is active and benefits verified through [place] - $45.00 co-pay, no deductible, out of pocket amount of $5,900/$0.00 has been met, no co-insurance, and no pre-authorization is required. Passport/reference [phone]  Will call patient in interest to CR - if patient is interested then follow up appt will need to be completed at the Cardiologist office. Once appt is completed, patient will be contacted for scheduling upon review by the RN Navigator.

## 2017-04-22 ENCOUNTER — Ambulatory Visit: Payer: Medicare HMO | Admitting: Physician Assistant

## 2017-04-22 ENCOUNTER — Encounter: Payer: Self-pay | Admitting: Physician Assistant

## 2017-04-22 VITALS — BP 138/80 | HR 80 | Ht 65.0 in | Wt 181.0 lb

## 2017-04-22 DIAGNOSIS — I255 Ischemic cardiomyopathy: Secondary | ICD-10-CM | POA: Diagnosis not present

## 2017-04-22 DIAGNOSIS — I10 Essential (primary) hypertension: Secondary | ICD-10-CM | POA: Diagnosis not present

## 2017-04-22 DIAGNOSIS — I2102 ST elevation (STEMI) myocardial infarction involving left anterior descending coronary artery: Secondary | ICD-10-CM | POA: Diagnosis not present

## 2017-04-22 DIAGNOSIS — E876 Hypokalemia: Secondary | ICD-10-CM | POA: Diagnosis not present

## 2017-04-22 DIAGNOSIS — I2510 Atherosclerotic heart disease of native coronary artery without angina pectoris: Secondary | ICD-10-CM

## 2017-04-22 LAB — BASIC METABOLIC PANEL
BUN/Creatinine Ratio: 18 (ref 12–28)
BUN: 17 mg/dL (ref 8–27)
CALCIUM: 9.9 mg/dL (ref 8.7–10.3)
CO2: 23 mmol/L (ref 20–29)
Chloride: 102 mmol/L (ref 96–106)
Creatinine, Ser: 0.97 mg/dL (ref 0.57–1.00)
GFR, EST AFRICAN AMERICAN: 70 mL/min/{1.73_m2} (ref >59)
GFR, EST NON AFRICAN AMERICAN: 61 mL/min/{1.73_m2} (ref >59)
Glucose: 99 mg/dL (ref 65–99)
POTASSIUM: 4.8 mmol/L (ref 3.5–5.2)
SODIUM: 141 mmol/L (ref 134–144)

## 2017-04-22 MED ORDER — METOPROLOL SUCCINATE ER 25 MG PO TB24: 25.0000 mg | ORAL_TABLET | Freq: Every day | ORAL | 2 refills | Status: DC

## 2017-04-22 NOTE — Progress Notes (Signed)
Cardiology Office Note    Date:  04/22/2017   ID:  Allison Sanders, DOB April 03, 1950, MRN [id number]  PCP:  Seward Carol, MD  Cardiologist:  Dr. Martinique  Chief Complaint  Patient presents with  . Hospitalization Follow-up    pt c/o drowsiness about an hour after taking her medicine--does not last long; states she has lost 3 lbs since getting home; c/o toes on right foot were cold and hurting the night before last and last night--just started. No other Sx.    History of Present Illness:  Allison Sanders is a 67 y.o. female with PMH of HTN who recently presented to the hospital with anterior STEMI.  Cardiac catheterization performed on 04/05/2017 showed 100% proximal LAD lesion treated with synergy 3.0 x 20 mm DES, 100% ostial diagonal occlusion, 25% proximal to mid RCA, 30% proximal left circumflex lesion, EF was 50-55% by LV gram.  Follow-up echocardiogram obtained on 04/06/2017 showed EF 35-40%, akinesis of the mid apical anteroseptal, and apical myocardium, grade 2 DD.  Initial lipid panel showed well-controlled cholesterol except for LDL of 95.  Potassium was 2.9 on arrival.  Hemoglobin A1c was 5.7.  Her serial troponin eventually trended up to 4.75.  Post-cath, patient was placed on aspirin, Lipitor, metoprolol and Brilinta.  Patient presents today for cardiology office evaluation.  After the recent STEMI, she has not really experienced any substernal tightness or arm pain.  She has been compliant with dual antiplatelet medications.  She does get a little bit of weakness in the morning after she take all 3 of blood pressure medications.  Her blood pressure is 138/80 today, however this is before any blood pressure medication.  I will hold off on increasing her blood pressure medication at this time.  I did instruct her to move to metoprolol succinate to nighttime.  Given the recent hypokalemia noted in the hospital, I will obtain a basic metabolic panel.  She will need a fasting lipid  panel and LFT in 6-8 weeks.  She can follow-up with Dr. Almyra Free in 2-20-month.  Her EKG today continue to show significant T wave inversion in anterior lead which likely is a evolutionary changes.  Given lack of chest discomfort, I will hold off on further workup.  I will defer to Dr. Martinique to decide whether or not to repeat echocardiogram in 3 months.   Past Medical History:  Diagnosis Date  . HTN (hypertension)   . Kidney stone     Past Surgical History:  Procedure Laterality Date  . CORONARY STENT INTERVENTION N/A 04/05/2017   Procedure: CORONARY STENT INTERVENTION;  Surgeon: Martinique, Peter M, MD;  Location: [facility];  Service: Cardiovascular;  Laterality: N/A;  . CORONARY/GRAFT ACUTE MI REVASCULARIZATION N/A 04/05/2017   Procedure: Coronary/Graft Acute MI Revascularization;  Surgeon: Martinique, Peter M, MD;  Location: [facility];  Service: Cardiovascular;  Laterality: N/A;  . DILATION AND CURETTAGE, DIAGNOSTIC / THERAPEUTIC    . LEFT HEART CATH AND CORONARY ANGIOGRAPHY N/A 04/05/2017   Procedure: LEFT HEART CATH AND CORONARY ANGIOGRAPHY;  Surgeon: Martinique, Peter M, MD;  Location: [facility];  Service: Cardiovascular;  Laterality: N/A;  . left nephrectomy      Current Medications: Outpatient Medications Prior to Visit  Medication Sig Dispense Refill  . aspirin 81 MG chewable tablet Chew 1 tablet (81 mg total) by mouth daily. 30 tablet 2  . atorvastatin (LIPITOR) 80 MG tablet Take 1 tablet (80 mg total) by mouth daily  at 6 PM. 30 tablet 2  . cholecalciferol (VITAMIN D) 1000 UNITS tablet Take 2,000 Units by mouth daily.    . irbesartan (AVAPRO) 75 MG tablet Take 1 tablet (75 mg total) by mouth daily. 30 tablet 2  . nitroGLYCERIN (NITROSTAT) 0.4 MG SL tablet Place 1 tablet (0.4 mg total) under the tongue every 5 (five) minutes x 3 doses as needed for chest pain. 30 tablet 2  . spironolactone (ALDACTONE) 25 MG tablet Take 0.5 tablets (12.5 mg total) by mouth daily. 30  tablet 2  . ticagrelor (BRILINTA) 90 MG TABS tablet Take 1 tablet (90 mg total) by mouth 2 (two) times daily. 60 tablet 11  . vitamin C (ASCORBIC ACID) 250 MG tablet Take 250 mg by mouth daily.    . metoprolol succinate (TOPROL-XL) 25 MG 24 hr tablet Take 1 tablet (25 mg total) by mouth daily. 30 tablet 2   No facility-administered medications prior to visit.      Allergies:   Penicillins; Sulfa antibiotics; and Contrast media [iodinated diagnostic agents]   Social History   Socioeconomic History  . Marital status: Single    Spouse name: None  . Number of children: 2  . Years of education: None  . Highest education level: None  Social Needs  . Financial resource strain: None  . Food insecurity - worry: None  . Food insecurity - inability: None  . Transportation needs - medical: None  . Transportation needs - non-medical: None  Occupational History  . None  Tobacco Use  . Smoking status: Never Smoker  . Smokeless tobacco: Never Used  Substance and Sexual Activity  . Alcohol use: No    Alcohol/week: 0.0 oz    Frequency: Never  . Drug use: No  . Sexual activity: None  Other Topics Concern  . None  Social History Narrative  . None     Family History:  The patient's family history includes Heart attack in her father.   ROS:   Please see the history of present illness.    ROS All other systems reviewed and are negative.   PHYSICAL EXAM:   VS:  BP 138/80   Pulse 80   Ht 5\' 5"  (1.651 m)   Wt 181 lb (82.1 kg)   BMI 30.12 kg/m    GEN: Well nourished, well developed, in no acute distress  HEENT: normal  Neck: no JVD, carotid bruits, or masses Cardiac: RRR; no murmurs, rubs, or gallops,no edema  Respiratory:  clear to auscultation bilaterally, normal work of breathing GI: soft, nontender, nondistended, + BS MS: no deformity or atrophy  Skin: warm and dry, no rash Neuro:  Alert and Oriented x 3, Strength and sensation are intact Psych: euthymic mood, full  affect  Wt Readings from Last 3 Encounters:  04/22/17 181 lb (82.1 kg)  04/08/17 187 lb (84.8 kg)  10/20/16 185 lb (83.9 kg)      Studies/Labs Reviewed:   EKG:  EKG is ordered today.  The ekg ordered today demonstrates normal sinus rhythm with T wave inversion in anterior leads.  Recent Labs: 04/05/2017: ALT 19 04/06/2017: Hemoglobin 14.8; Platelets 210 04/07/2017: Magnesium 2.0 04/08/2017: BUN 21; Creatinine, Ser 1.02; Potassium 4.0; Sodium 138   Lipid Panel    Component Value Date/Time   CHOL 167 04/05/2017 2044   TRIG 123 04/05/2017 2044   HDL 51 04/05/2017 2044   CHOLHDL 3.3 04/05/2017 2044   VLDL 25 04/05/2017 2044   LDLCALC 91 04/05/2017 2044  Additional studies/ records that were reviewed today include:   Cath 04/05/2017 Conclusion     Prox LAD lesion is 100% stenosed.  A drug-eluting stent was successfully placed using a STENT SYNERGY DES 3X20.  Post intervention, there is a 0% residual stenosis.  Ost 1st Diag lesion is 100% stenosed.  Prox RCA to Mid RCA lesion is 25% stenosed.  Prox Cx lesion is 30% stenosed.  The left ventricular systolic function is normal.  LV end diastolic pressure is normal.  The left ventricular ejection fraction is 50-55% by visual estimate.   1. Single vessel occlusive CAD 2. Mild LV dysfunction with apical wall motion abnormality. 3. Normal LVEDP 4. Successful PCI and stenting of the mid LAD with DES  Plan: DAPT for one year. Risk factor modification.     Echo 04/06/2017 LV EF: 35% -   40%  ------------------------------------------------------------------- Indications:      CAD of native vessels 414.01.  ------------------------------------------------------------------- History:   Risk factors:  Hypertension.  ------------------------------------------------------------------- Study Conclusions  - Left ventricle: The cavity size was normal. Wall thickness was   normal. Systolic function was moderately  reduced. The estimated   ejection fraction was in the range of 35% to 40%. Akinesis of the   mid-apicalanteroseptal and apical myocardium. Features are   consistent with a pseudonormal left ventricular filling pattern,   with concomitant abnormal relaxation and increased filling   pressure (grade 2 diastolic dysfunction).  ASSESSMENT:    1. Coronary artery disease involving native coronary artery of native heart without angina pectoris   2. Acute ST elevation myocardial infarction (STEMI) involving left anterior descending (LAD) coronary artery ([place])   3. Essential hypertension   4. Ischemic cardiomyopathy   5. Hypokalemia      PLAN:  In order of problems listed above:  1. CAD: Recent anterior STEMI.  Continue aspirin, Brilinta, Lipitor, beta-blocker.  Emphasis has been placed on compliance with dual antiplatelet therapy.  Will need aggressive risk factor control.  I am quite surprised that her lipid panel is largely normal except for LDL of 95.  She has been started on high-dose statin.  She will need a fasting lipid panel and LFT in 6-8 weeks.  2. Ischemic cardiomyopathy: EF 35-40%.  On Toprol-XL, spironolactone and irbesartan.  She does have some dizziness when take all 3 medications together, I moved Toprol-XL to nighttime only.  She denies any palpitation along with dizziness.  Suspicion for ventricular ectopy is relatively low.  I will defer to Dr. Martinique to decide when to repeat echocardiogram.  3. Hypertension: Blood pressure mildly elevated today, however this is before any of his blood pressure medication.  I will hold off on increasing his blood pressure medication given the fact that she already has some degree of dizziness after taking all 3 meds.  4. Hypokalemia: Potassium 2.9 during the recent hospitalization, will obtain a basic metabolic panel today.    Medication Adjustments/Labs and Tests Ordered: Current medicines are reviewed at length with the patient today.   Concerns regarding medicines are outlined above.  Medication changes, Labs and Tests ordered today are listed in the Patient Instructions below. Patient Instructions  Medication Instructions:  Take Metoprolol at night   Labwork: Your physician recommends that you return for lab work in: TODAY-BMET Your physician recommends that you return for lab work in: 6-8 weeks LIPIDS, LFT(4/11-18)  Testing/Procedures: Schedule Cardiac Rehab order in chart  Follow-Up: Your physician recommends that you schedule a follow-up appointment in: 2-3 Months with Dr  Martinique  Any Other Special Instructions Will Be Listed Below (If Applicable).  If you need a refill on your cardiac medications before your next appointment, please call your pharmacy.      Hilbert Corrigan, [place]  04/22/2017 9:14 AM    [address], [place], [place]  [address] Phone: [phone]; Fax: [phone]

## 2017-04-22 NOTE — Patient Instructions (Signed)
Medication Instructions:  Take Metoprolol at night   Labwork: Your physician recommends that you return for lab work in: TODAY-BMET Your physician recommends that you return for lab work in: 6-8 weeks LIPIDS, LFT(4/11-18)  Testing/Procedures: Schedule Cardiac Rehab order in chart  Follow-Up: Your physician recommends that you schedule a follow-up appointment in: 2-3 Months with Dr Martinique  Any Other Special Instructions Will Be Listed Below (If Applicable).  If you need a refill on your cardiac medications before your next appointment, please call your pharmacy.

## 2017-04-30 ENCOUNTER — Telehealth: Payer: Self-pay | Admitting: Cardiology

## 2017-04-30 NOTE — Telephone Encounter (Signed)
Agree  Zayla Agar MD, FACC   

## 2017-04-30 NOTE — Telephone Encounter (Signed)
Spoke with patient and she had dark urine last night, no visible blood. She did read information on Brilinta and said to contact physician if visible blood or dark urine. She denies any pain/burning while urinating or feeling like she does not empty bladder completely. Last night was the first time she had this issue and she increased her fluid intake. Urine was not as dark this am. Lips are dry and pealing as well. She has not been eating or drinking as much since home from hospital. Advised patient to increase her water intake and if worse or no better to call back. Will forward to Dr Martinique for review.

## 2017-04-30 NOTE — Telephone Encounter (Signed)
New Message    Patient calling to report her urine is darker than normal , thinks she may be dehydrated   Pt c/o medication issue:  1. Name of Medication:  ticagrelor (BRILINTA) 90 MG TABS tablet Take 1 tablet (90 mg total) by mouth 2 (two) times daily.     2. How are you currently taking this medication (dosage and times per day)? 1 tablet 2x a day   3. Are you having a reaction (difficulty breathing--STAT)?  Not sure   4. What is your medication issue?  Her urine is darker than normal and her lips are chapped

## 2017-05-03 ENCOUNTER — Encounter: Payer: Self-pay | Admitting: *Deleted

## 2017-05-03 ENCOUNTER — Encounter: Payer: Medicare HMO | Attending: Cardiology | Admitting: *Deleted

## 2017-05-03 VITALS — Ht 65.0 in | Wt 179.8 lb

## 2017-05-03 DIAGNOSIS — Z87442 Personal history of urinary calculi: Secondary | ICD-10-CM | POA: Diagnosis not present

## 2017-05-03 DIAGNOSIS — I10 Essential (primary) hypertension: Secondary | ICD-10-CM | POA: Diagnosis not present

## 2017-05-03 DIAGNOSIS — Z955 Presence of coronary angioplasty implant and graft: Secondary | ICD-10-CM | POA: Diagnosis not present

## 2017-05-03 DIAGNOSIS — I213 ST elevation (STEMI) myocardial infarction of unspecified site: Secondary | ICD-10-CM

## 2017-05-03 NOTE — Progress Notes (Signed)
Cardiac Individual Treatment Plan  Patient Details  Name: Allison Sanders MRN: [id number] Date of Birth: 04-Jan-1951 Referring Provider:     Cardiac Rehab from 05/03/2017 in [facility] Cardiac and Pulmonary Rehab  Referring Provider  Martinique, Peter MD      Initial Encounter Date:    Cardiac Rehab from 05/03/2017 in [facility] Cardiac and Pulmonary Rehab  Date  05/03/17  Referring Provider  Martinique, Peter MD      Visit Diagnosis: ST elevation myocardial infarction (STEMI), unspecified artery [facility])  Status post coronary artery stent placement  Patient's Home Medications on Admission:  Current Outpatient Medications:  .  aspirin 81 MG chewable tablet, Chew 1 tablet (81 mg total) by mouth daily., Disp: 30 tablet, Rfl: 2 .  atorvastatin (LIPITOR) 80 MG tablet, Take 1 tablet (80 mg total) by mouth daily at 6 PM., Disp: 30 tablet, Rfl: 2 .  cholecalciferol (VITAMIN D) 1000 UNITS tablet, Take 2,000 Units by mouth daily., Disp: , Rfl:  .  irbesartan (AVAPRO) 75 MG tablet, Take 1 tablet (75 mg total) by mouth daily., Disp: 30 tablet, Rfl: 2 .  metoprolol succinate (TOPROL-XL) 25 MG 24 hr tablet, Take 1 tablet (25 mg total) by mouth at bedtime., Disp: 30 tablet, Rfl: 2 .  nitroGLYCERIN (NITROSTAT) 0.4 MG SL tablet, Place 1 tablet (0.4 mg total) under the tongue every 5 (five) minutes x 3 doses as needed for chest pain., Disp: 30 tablet, Rfl: 2 .  spironolactone (ALDACTONE) 25 MG tablet, Take 0.5 tablets (12.5 mg total) by mouth daily., Disp: 30 tablet, Rfl: 2 .  ticagrelor (BRILINTA) 90 MG TABS tablet, Take 1 tablet (90 mg total) by mouth 2 (two) times daily., Disp: 60 tablet, Rfl: 11 .  vitamin C (ASCORBIC ACID) 250 MG tablet, Take 250 mg by mouth daily., Disp: , Rfl:   Past Medical History: Past Medical History:  Diagnosis Date  . HTN (hypertension)   . Kidney stone     Tobacco Use: Social History   Tobacco Use  Smoking Status Never Smoker  Smokeless Tobacco Never Used    Labs: Recent  Review Flowsheet Data    Labs for ITP Cardiac and Pulmonary Rehab Latest Ref Rng & Units 04/05/2017 04/05/2017 04/05/2017   Cholestrol 0 - 200 mg/dL 175 - 167   LDLCALC 0 - 99 mg/dL 95 - 91   HDL >40 mg/dL 53 - 51   Trlycerides <150 mg/dL 137 - 123   Hemoglobin A1c 4.8 - 5.6 % - - 5.7(H)   TCO2 22 - 32 mmol/L - 24 -       Exercise Target Goals: Date: 05/03/17  Exercise Program Goal: Individual exercise prescription set using results from initial 6 min walk test and THRR while considering  patient's activity barriers and safety.   Exercise Prescription Goal: Initial exercise prescription builds to 30-45 minutes a day of aerobic activity, 2-3 days per week.  Home exercise guidelines will be given to patient during program as part of exercise prescription that the participant will acknowledge.  Activity Barriers & Risk Stratification: Activity Barriers & Cardiac Risk Stratification - 05/03/17 1333      Activity Barriers & Cardiac Risk Stratification   Activity Barriers  Other (comment)    Comments  Has bladder concerns and was instructed by her physician not to lift. She  voiced that she is aware to not do any exercise that she feels may hurt her bladder condition.        6 Minute Walk: 6 Minute  Walk    Row Name 05/03/17 1519         6 Minute Walk   Phase  Initial     Distance  1115 feet     Walk Time  6 minutes     # of Rest Breaks  0     MPH  2.11     METS  2.79     RPE  10     VO2 Peak  9.76     Symptoms  No     Resting HR  56 bpm     Resting BP  126/82     Resting Oxygen Saturation   100 %     Exercise Oxygen Saturation  during 6 min walk  100 %     Max Ex. HR  107 bpm     Max Ex. BP  150/68     2 Minute Post BP  124/70        Oxygen Initial Assessment:   Oxygen Re-Evaluation:   Oxygen Discharge (Final Oxygen Re-Evaluation):   Initial Exercise Prescription: Initial Exercise Prescription - 05/03/17 1500      Date of Initial Exercise RX and Referring  Provider   Date  05/03/17    Referring Provider  Martinique, Peter MD      Treadmill   MPH  2.1    Grade  0.5    Minutes  15    METs  2.75      NuStep   Level  1    SPM  80    Minutes  15    METs  2.7      Arm Ergometer   Level  1    Watts  38    RPM  25    Minutes  15    METs  2.79      Prescription Details   Frequency (times per week)  3    Duration  Progress to 45 minutes of aerobic exercise without signs/symptoms of physical distress      Intensity   THRR 40-80% of Max Heartrate  95-134    Ratings of Perceived Exertion  11-13    Perceived Dyspnea  0-4      Progression   Progression  Continue to progress workloads to maintain intensity without signs/symptoms of physical distress.      Resistance Training   Training Prescription  Yes    Weight  3    Reps  10-15       Perform Capillary Blood Glucose checks as needed.  Exercise Prescription Changes: Exercise Prescription Changes    Row Name 05/03/17 1500             Response to Exercise   Blood Pressure (Admit)  126/82       Blood Pressure (Exercise)  150/68       Blood Pressure (Exit)  124/70       Heart Rate (Admit)  56 bpm       Heart Rate (Exercise)  107 bpm       Heart Rate (Exit)  62 bpm       Oxygen Saturation (Admit)  100 %       Oxygen Saturation (Exercise)  100 %       Rating of Perceived Exertion (Exercise)  10       Symptoms  none       Comments  walk test results          Exercise Comments:  Exercise Goals and Review: Exercise Goals    Row Name 05/03/17 1523             Exercise Goals   Increase Physical Activity  Yes       Intervention  Provide advice, education, support and counseling about physical activity/exercise needs.;Develop an individualized exercise prescription for aerobic and resistive training based on initial evaluation findings, risk stratification, comorbidities and participant's personal goals.       Expected Outcomes  Short Term: Attend rehab on a regular  basis to increase amount of physical activity.;Long Term: Add in home exercise to make exercise part of routine and to increase amount of physical activity.;Long Term: Exercising regularly at least 3-5 days a week.       Increase Strength and Stamina  Yes       Intervention  Provide advice, education, support and counseling about physical activity/exercise needs.;Develop an individualized exercise prescription for aerobic and resistive training based on initial evaluation findings, risk stratification, comorbidities and participant's personal goals.       Expected Outcomes  Short Term: Increase workloads from initial exercise prescription for resistance, speed, and METs.;Long Term: Improve cardiorespiratory fitness, muscular endurance and strength as measured by increased METs and functional capacity (6MWT);Short Term: Perform resistance training exercises routinely during rehab and add in resistance training at home       Able to understand and use rate of perceived exertion (RPE) scale  Yes       Intervention  Provide education and explanation on how to use RPE scale       Expected Outcomes  Short Term: Able to use RPE daily in rehab to express subjective intensity level;Long Term:  Able to use RPE to guide intensity level when exercising independently       Knowledge and understanding of Target Heart Rate Range (THRR)  Yes       Intervention  Provide education and explanation of THRR including how the numbers were predicted and where they are located for reference       Expected Outcomes  Short Term: Able to state/look up THRR;Short Term: Able to use daily as guideline for intensity in rehab;Long Term: Able to use THRR to govern intensity when exercising independently       Able to check pulse independently  Yes       Intervention  Provide education and demonstration on how to check pulse in carotid and radial arteries.;Review the importance of being able to check your own pulse for safety during  independent exercise       Expected Outcomes  Short Term: Able to explain why pulse checking is important during independent exercise;Long Term: Able to check pulse independently and accurately       Understanding of Exercise Prescription  Yes       Intervention  Provide education, explanation, and written materials on patient's individual exercise prescription       Expected Outcomes  Short Term: Able to explain program exercise prescription;Long Term: Able to explain home exercise prescription to exercise independently          Exercise Goals Re-Evaluation :   Discharge Exercise Prescription (Final Exercise Prescription Changes): Exercise Prescription Changes - 05/03/17 1500      Response to Exercise   Blood Pressure (Admit)  126/82    Blood Pressure (Exercise)  150/68    Blood Pressure (Exit)  124/70    Heart Rate (Admit)  56 bpm    Heart Rate (Exercise)  107 bpm  Heart Rate (Exit)  62 bpm    Oxygen Saturation (Admit)  100 %    Oxygen Saturation (Exercise)  100 %    Rating of Perceived Exertion (Exercise)  10    Symptoms  none    Comments  walk test results       Nutrition:  Target Goals: Understanding of nutrition guidelines, daily intake of sodium '1500mg'$ , cholesterol '200mg'$ , calories 30% from fat and 7% or less from saturated fats, daily to have 5 or more servings of fruits and vegetables.  Biometrics: Pre Biometrics - 05/03/17 1524      Pre Biometrics   Height  '5\' 5"'$  (1.651 m)    Weight  179 lb 12.8 oz (81.6 kg)    Waist Circumference  33 inches    Hip Circumference  45 inches    Waist to Hip Ratio  0.73 %    BMI (Calculated)  29.92    Single Leg Stand  25.25 seconds        Nutrition Therapy Plan and Nutrition Goals: Nutrition Therapy & Goals - 05/03/17 1308      Intervention Plan   Intervention  Prescribe, educate and counsel regarding individualized specific dietary modifications aiming towards targeted core components such as weight, hypertension, lipid  management, diabetes, heart failure and other comorbidities.    Expected Outcomes  Short Term Goal: Understand basic principles of dietary content, such as calories, fat, sodium, cholesterol and nutrients.;Short Term Goal: A plan has been developed with personal nutrition goals set during dietitian appointment.;Long Term Goal: Adherence to prescribed nutrition plan.       Nutrition Assessments: Nutrition Assessments - 05/03/17 1308      MEDFICTS Scores   Pre Score  3       Nutrition Goals Re-Evaluation:   Nutrition Goals Discharge (Final Nutrition Goals Re-Evaluation):   Psychosocial: Target Goals: Acknowledge presence or absence of significant depression and/or stress, maximize coping skills, provide positive support system. Participant is able to verbalize types and ability to use techniques and skills needed for reducing stress and depression.   Initial Review & Psychosocial Screening: Initial Psych Review & Screening - 05/03/17 1324      Initial Review   Current issues with  Current Anxiety/Panic;Current Stress Concerns    Source of Stress Concerns  Financial;Family    Comments  Cares for her  72 yo son at home. he is a quadriplegic.  Is in process of a divorce.     Financial concerns with divorce and medical bills.      Family Dynamics   Good Support System?  Yes Family and Friends      Barriers   Psychosocial barriers to participate in program  There are no identifiable barriers or psychosocial needs.;The patient should benefit from training in stress management and relaxation.      Screening Interventions   Interventions  Encouraged to exercise;Provide feedback about the scores to participant;To provide support and resources with identified psychosocial needs    Expected Outcomes  Short Term goal: Utilizing psychosocial counselor, staff and physician to assist with identification of specific Stressors or current issues interfering with healing process. Setting desired  goal for each stressor or current issue identified.;Long Term Goal: Stressors or current issues are controlled or eliminated.;Short Term goal: Identification and review with participant of any Quality of Life or Depression concerns found by scoring the questionnaire.;Long Term goal: The participant improves quality of Life and PHQ9 Scores as seen by post scores and/or verbalization of changes  Quality of Life Scores:  Quality of Life - 05/03/17 1329      Quality of Life Scores   Health/Function Pre  25.5 %    Socioeconomic Pre  22.83 %    Psych/Spiritual Pre  24.21 %    Family Pre  27 %    GLOBAL Pre  24.89 %      Scores of 19 and below usually indicate a poorer quality of life in these areas.  A difference of  2-3 points is a clinically meaningful difference.  A difference of 2-3 points in the total score of the Quality of Life Index has been associated with significant improvement in overall quality of life, self-image, physical symptoms, and general health in studies assessing change in quality of life.  PHQ-9: Recent Review Flowsheet Data    Depression screen [facility] 2/9 05/03/2017   Decreased Interest 0   Down, Depressed, Hopeless 0   PHQ - 2 Score 0   Altered sleeping 0   Change in appetite 0   Feeling bad or failure about yourself  0   Trouble concentrating 0   Moving slowly or fidgety/restless 0   Suicidal thoughts 0   PHQ-9 Score 0   Difficult doing work/chores Not difficult at all     Interpretation of Total Score  Total Score Depression Severity:  1-4 = Minimal depression, 5-9 = Mild depression, 10-14 = Moderate depression, 15-19 = Moderately severe depression, 20-27 = Severe depression   Psychosocial Evaluation and Intervention:   Psychosocial Re-Evaluation:   Psychosocial Discharge (Final Psychosocial Re-Evaluation):   Vocational Rehabilitation: Provide vocational rehab assistance to qualifying candidates.   Vocational Rehab Evaluation &  Intervention: Vocational Rehab - 05/03/17 1333      Initial Vocational Rehab Evaluation & Intervention   Assessment shows need for Vocational Rehabilitation  No       Education: Education Goals: Education classes will be provided on a variety of topics geared toward better understanding of heart health and risk factor modification. Participant will state understanding/return demonstration of topics presented as noted by education test scores.  Learning Barriers/Preferences: Learning Barriers/Preferences - 05/03/17 1332      Learning Barriers/Preferences   Learning Barriers  None    Learning Preferences  None       Education Topics:  AED/CPR: - Group verbal and written instruction with the use of models to demonstrate the basic use of the AED with the basic ABC's of resuscitation.   General Nutrition Guidelines/Fats and Fiber: -Group instruction provided by verbal, written material, models and posters to present the general guidelines for heart healthy nutrition. Gives an explanation and review of dietary fats and fiber.   Controlling Sodium/Reading Food Labels: -Group verbal and written material supporting the discussion of sodium use in heart healthy nutrition. Review and explanation with models, verbal and written materials for utilization of the food label.   Exercise Physiology & General Exercise Guidelines: - Group verbal and written instruction with models to review the exercise physiology of the cardiovascular system and associated critical values. Provides general exercise guidelines with specific guidelines to those with heart or lung disease.    Aerobic Exercise & Resistance Training: - Gives group verbal and written instruction on the various components of exercise. Focuses on aerobic and resistive training programs and the benefits of this training and how to safely progress through these programs..   Flexibility, Balance, Mind/Body Relaxation: Provides group  verbal/written instruction on the benefits of flexibility and balance training, including mind/body exercise modes  such as yoga, pilates and tai chi.  Demonstration and skill practice provided.   Stress and Anxiety: - Provides group verbal and written instruction about the health risks of elevated stress and causes of high stress.  Discuss the correlation between heart/lung disease and anxiety and treatment options. Review healthy ways to manage with stress and anxiety.   Depression: - Provides group verbal and written instruction on the correlation between heart/lung disease and depressed mood, treatment options, and the stigmas associated with seeking treatment.   Anatomy & Physiology of the Heart: - Group verbal and written instruction and models provide basic cardiac anatomy and physiology, with the coronary electrical and arterial systems. Review of Valvular disease and Heart Failure   Cardiac Procedures: - Group verbal and written instruction to review commonly prescribed medications for heart disease. Reviews the medication, class of the drug, and side effects. Includes the steps to properly store meds and maintain the prescription regimen. (beta blockers and nitrates)   Cardiac Medications I: - Group verbal and written instruction to review commonly prescribed medications for heart disease. Reviews the medication, class of the drug, and side effects. Includes the steps to properly store meds and maintain the prescription regimen.   Cardiac Medications II: -Group verbal and written instruction to review commonly prescribed medications for heart disease. Reviews the medication, class of the drug, and side effects. (all other drug classes)    Go Sex-Intimacy & Heart Disease, Get SMART - Goal Setting: - Group verbal and written instruction through game format to discuss heart disease and the return to sexual intimacy. Provides group verbal and written material to discuss and apply  goal setting through the application of the S.M.A.R.T. Method.   Other Matters of the Heart: - Provides group verbal, written materials and models to describe Stable Angina and Peripheral Artery. Includes description of the disease process and treatment options available to the cardiac patient.   Exercise & Equipment Safety: - Individual verbal instruction and demonstration of equipment use and safety with use of the equipment.   Cardiac Rehab from 05/03/2017 in [facility] Cardiac and Pulmonary Rehab  Date  05/03/17  Educator  [facility]  Instruction Review Code  1- Verbalizes Understanding      Infection Prevention: - Provides verbal and written material to individual with discussion of infection control including proper hand washing and proper equipment cleaning during exercise session.   Cardiac Rehab from 05/03/2017 in [facility] Cardiac and Pulmonary Rehab  Date  05/03/17  Educator  [facility]  Instruction Review Code  1- Verbalizes Understanding      Falls Prevention: - Provides verbal and written material to individual with discussion of falls prevention and safety.   Cardiac Rehab from 05/03/2017 in [facility] Cardiac and Pulmonary Rehab  Date  05/03/17  Educator  [facility]  Instruction Review Code  1- Verbalizes Understanding      Diabetes: - Individual verbal and written instruction to review signs/symptoms of diabetes, desired ranges of glucose level fasting, after meals and with exercise. Acknowledge that pre and post exercise glucose checks will be done for 3 sessions at entry of program.   Know Your Numbers and Risk Factors: -Group verbal and written instruction about important numbers in your health.  Discussion of what are risk factors and how they play a role in the disease process.  Review of Cholesterol, Blood Pressure, Diabetes, and BMI and the role they play in your overall health.   Sleep Hygiene: -Provides group verbal and written instruction about how sleep can  affect your health.  Define  sleep hygiene, discuss sleep cycles and impact of sleep habits. Review good sleep hygiene tips.    Other: -Provides group and verbal instruction on various topics (see comments)   Knowledge Questionnaire Score: Knowledge Questionnaire Score - 05/03/17 1332      Knowledge Questionnaire Score   Pre Score  25/28 Reviewed correct responses with Mardene Celeste. She verbalized understanding of the correct responses and had no further questions today       Core Components/Risk Factors/Patient Goals at Admission: Personal Goals and Risk Factors at Admission - 05/03/17 1308      Core Components/Risk Factors/Patient Goals on Admission    Weight Management  Obesity;Yes    Intervention  Weight Management: Develop a combined nutrition and exercise program designed to reach desired caloric intake, while maintaining appropriate intake of nutrient and fiber, sodium and fats, and appropriate energy expenditure required for the weight goal.;Weight Management: Provide education and appropriate resources to help participant work on and attain dietary goals.;Weight Management/Obesity: Establish reasonable short term and long term weight goals.;Obesity: Provide education and appropriate resources to help participant work on and attain dietary goals.    Admit Weight  179 lb 12.8 oz (81.6 kg)    Goal Weight: Short Term  177 lb (80.3 kg)    Goal Weight: Long Term  155 lb (70.3 kg)    Expected Outcomes  Short Term: Continue to assess and modify interventions until short term weight is achieved;Weight Loss: Understanding of general recommendations for a balanced deficit meal plan, which promotes 1-2 lb weight loss per week and includes a negative energy balance of [phone] kcal/d    Hypertension  Yes    Intervention  Provide education on lifestyle modifcations including regular physical activity/exercise, weight management, moderate sodium restriction and increased consumption of fresh fruit, vegetables, and low fat dairy,  alcohol moderation, and smoking cessation.;Monitor prescription use compliance.    Expected Outcomes  Short Term: Continued assessment and intervention until BP is < 140/46m HG in hypertensive participants. < 130/885mHG in hypertensive participants with diabetes, heart failure or chronic kidney disease.;Long Term: Maintenance of blood pressure at goal levels.    Lipids  Yes    Intervention  Provide education and support for participant on nutrition & aerobic/resistive exercise along with prescribed medications to achieve LDL <7070mHDL >58m69m  Expected Outcomes  Short Term: Participant states understanding of desired cholesterol values and is compliant with medications prescribed. Participant is following exercise prescription and nutrition guidelines.;Long Term: Cholesterol controlled with medications as prescribed, with individualized exercise RX and with personalized nutrition plan. Value goals: LDL < 70mg14mL > 40 mg.    Stress  Yes Cares for 33 yo54on at home. He is quadriplegic    Intervention  Offer individual and/or small group education and counseling on adjustment to heart disease, stress management and health-related lifestyle change. Teach and support self-help strategies.;Refer participants experiencing significant psychosocial distress to appropriate mental health specialists for further evaluation and treatment. When possible, include family members and significant others in education/counseling sessions.    Expected Outcomes  Short Term: Participant demonstrates changes in health-related behavior, relaxation and other stress management skills, ability to obtain effective social support, and compliance with psychotropic medications if prescribed.;Long Term: Emotional wellbeing is indicated by absence of clinically significant psychosocial distress or social isolation.       Core Components/Risk Factors/Patient Goals Review:    Core Components/Risk Factors/Patient Goals at Discharge  (Final Review):  ITP Comments: ITP Comments    Row Name 05/03/17 1302           ITP Comments  Medical review completed today. ITP sent to Dr. Sabra Heck to review, change as needed and to sign. Documentation of diagnosis can be foundin [facility] 04/05/2017 04/22/2017          Comments: Initial ITP

## 2017-05-03 NOTE — Patient Instructions (Signed)
Patient Instructions  Patient Details  Name: Allison Sanders MRN: [id number] Date of Birth: 1950/10/21 Referring Provider:  Martinique, Peter M, MD  Below are your personal goals for exercise, nutrition, and risk factors. Our goal is to help you stay on track towards obtaining and maintaining these goals. We will be discussing your progress on these goals with you throughout the program.  Initial Exercise Prescription: Initial Exercise Prescription - 05/03/17 1500      Date of Initial Exercise RX and Referring Provider   Date  05/03/17    Referring Provider  Martinique, Peter MD      Treadmill   MPH  2.1    Grade  0.5    Minutes  15    METs  2.75      NuStep   Level  1    SPM  80    Minutes  15    METs  2.7      Arm Ergometer   Level  1    Watts  38    RPM  25    Minutes  15    METs  2.79      Prescription Details   Frequency (times per week)  3    Duration  Progress to 45 minutes of aerobic exercise without signs/symptoms of physical distress      Intensity   THRR 40-80% of Max Heartrate  95-134    Ratings of Perceived Exertion  11-13    Perceived Dyspnea  0-4      Progression   Progression  Continue to progress workloads to maintain intensity without signs/symptoms of physical distress.      Resistance Training   Training Prescription  Yes    Weight  3    Reps  10-15       Exercise Goals: Frequency: Be able to perform aerobic exercise two to three times per week in program working toward 2-5 days per week of home exercise.  Intensity: Work with a perceived exertion of 11 (fairly light) - 15 (hard) while following your exercise prescription.  We will make changes to your prescription with you as you progress through the program.   Duration: Be able to do 30 to 45 minutes of continuous aerobic exercise in addition to a 5 minute warm-up and a 5 minute cool-down routine.   Nutrition Goals: Your personal nutrition goals will be established when you do your  nutrition analysis with the dietician.  The following are general nutrition guidelines to follow: Cholesterol < 200mg /day Sodium < 1500mg /day Fiber: Women over 50 yrs - 21 grams per day  Personal Goals: Personal Goals and Risk Factors at Admission - 05/03/17 1308      Core Components/Risk Factors/Patient Goals on Admission    Weight Management  Obesity;Yes    Intervention  Weight Management: Develop a combined nutrition and exercise program designed to reach desired caloric intake, while maintaining appropriate intake of nutrient and fiber, sodium and fats, and appropriate energy expenditure required for the weight goal.;Weight Management: Provide education and appropriate resources to help participant work on and attain dietary goals.;Weight Management/Obesity: Establish reasonable short term and long term weight goals.;Obesity: Provide education and appropriate resources to help participant work on and attain dietary goals.    Admit Weight  179 lb 12.8 oz (81.6 kg)    Goal Weight: Short Term  177 lb (80.3 kg)    Goal Weight: Long Term  155 lb (70.3 kg)    Expected Outcomes  Short Term:  Continue to assess and modify interventions until short term weight is achieved;Weight Loss: Understanding of general recommendations for a balanced deficit meal plan, which promotes 1-2 lb weight loss per week and includes a negative energy balance of [phone] kcal/d    Hypertension  Yes    Intervention  Provide education on lifestyle modifcations including regular physical activity/exercise, weight management, moderate sodium restriction and increased consumption of fresh fruit, vegetables, and low fat dairy, alcohol moderation, and smoking cessation.;Monitor prescription use compliance.    Expected Outcomes  Short Term: Continued assessment and intervention until BP is < 140/75mm HG in hypertensive participants. < 130/74mm HG in hypertensive participants with diabetes, heart failure or chronic kidney  disease.;Long Term: Maintenance of blood pressure at goal levels.    Lipids  Yes    Intervention  Provide education and support for participant on nutrition & aerobic/resistive exercise along with prescribed medications to achieve LDL 70mg , HDL >40mg .    Expected Outcomes  Short Term: Participant states understanding of desired cholesterol values and is compliant with medications prescribed. Participant is following exercise prescription and nutrition guidelines.;Long Term: Cholesterol controlled with medications as prescribed, with individualized exercise RX and with personalized nutrition plan. Value goals: LDL < 70mg , HDL > 40 mg.    Stress  Yes Cares for 6 yo son at home. He is quadriplegic    Intervention  Offer individual and/or small group education and counseling on adjustment to heart disease, stress management and health-related lifestyle change. Teach and support self-help strategies.;Refer participants experiencing significant psychosocial distress to appropriate mental health specialists for further evaluation and treatment. When possible, include family members and significant others in education/counseling sessions.    Expected Outcomes  Short Term: Participant demonstrates changes in health-related behavior, relaxation and other stress management skills, ability to obtain effective social support, and compliance with psychotropic medications if prescribed.;Long Term: Emotional wellbeing is indicated by absence of clinically significant psychosocial distress or social isolation.       Tobacco Use Initial Evaluation: Social History   Tobacco Use  Smoking Status Never Smoker  Smokeless Tobacco Never Used    Exercise Goals and Review: Exercise Goals    Row Name 05/03/17 1523             Exercise Goals   Increase Physical Activity  Yes       Intervention  Provide advice, education, support and counseling about physical activity/exercise needs.;Develop an individualized  exercise prescription for aerobic and resistive training based on initial evaluation findings, risk stratification, comorbidities and participant's personal goals.       Expected Outcomes  Short Term: Attend rehab on a regular basis to increase amount of physical activity.;Long Term: Add in home exercise to make exercise part of routine and to increase amount of physical activity.;Long Term: Exercising regularly at least 3-5 days a week.       Increase Strength and Stamina  Yes       Intervention  Provide advice, education, support and counseling about physical activity/exercise needs.;Develop an individualized exercise prescription for aerobic and resistive training based on initial evaluation findings, risk stratification, comorbidities and participant's personal goals.       Expected Outcomes  Short Term: Increase workloads from initial exercise prescription for resistance, speed, and METs.;Long Term: Improve cardiorespiratory fitness, muscular endurance and strength as measured by increased METs and functional capacity (6MWT);Short Term: Perform resistance training exercises routinely during rehab and add in resistance training at home       Able to understand  and use rate of perceived exertion (RPE) scale  Yes       Intervention  Provide education and explanation on how to use RPE scale       Expected Outcomes  Short Term: Able to use RPE daily in rehab to express subjective intensity level;Long Term:  Able to use RPE to guide intensity level when exercising independently       Knowledge and understanding of Target Heart Rate Range (THRR)  Yes       Intervention  Provide education and explanation of THRR including how the numbers were predicted and where they are located for reference       Expected Outcomes  Short Term: Able to state/look up THRR;Short Term: Able to use daily as guideline for intensity in rehab;Long Term: Able to use THRR to govern intensity when exercising independently       Able  to check pulse independently  Yes       Intervention  Provide education and demonstration on how to check pulse in carotid and radial arteries.;Review the importance of being able to check your own pulse for safety during independent exercise       Expected Outcomes  Short Term: Able to explain why pulse checking is important during independent exercise;Long Term: Able to check pulse independently and accurately       Understanding of Exercise Prescription  Yes       Intervention  Provide education, explanation, and written materials on patient's individual exercise prescription       Expected Outcomes  Short Term: Able to explain program exercise prescription;Long Term: Able to explain home exercise prescription to exercise independently          Copy of goals given to participant.

## 2017-05-10 DIAGNOSIS — Z87442 Personal history of urinary calculi: Secondary | ICD-10-CM | POA: Diagnosis not present

## 2017-05-10 DIAGNOSIS — I10 Essential (primary) hypertension: Secondary | ICD-10-CM | POA: Diagnosis not present

## 2017-05-10 DIAGNOSIS — Z955 Presence of coronary angioplasty implant and graft: Secondary | ICD-10-CM | POA: Diagnosis not present

## 2017-05-10 DIAGNOSIS — I213 ST elevation (STEMI) myocardial infarction of unspecified site: Secondary | ICD-10-CM

## 2017-05-10 NOTE — Progress Notes (Signed)
Daily Session Note  Patient Details  Name: Allison Sanders MRN: [id number] Date of Birth: 1950-09-08 Referring Provider:     Cardiac Rehab from 05/03/2017 in [facility] Cardiac and Pulmonary Rehab  Referring Provider  Martinique, Peter MD      Encounter Date: 05/10/2017  Check In: Session Check In - 05/10/17 1715      Check-In   Location  [facility]-Cardiac & Pulmonary Rehab    Staff Present  Renita Papa, RN Moises Blood, BS, ACSM CEP, Exercise Physiologist;Brinae Woods Oletta Darter, BA, ACSM CEP, Exercise Physiologist;Carroll Enterkin, RN, BSN    Supervising physician immediately available to respond to emergencies  See telemetry face sheet for immediately available ER MD    Medication changes reported      No    Fall or balance concerns reported     No    Warm-up and Cool-down  Performed on first and last piece of equipment    Resistance Training Performed  Yes    VAD Patient?  No      Pain Assessment   Currently in Pain?  No/denies    Multiple Pain Sites  No          Social History   Tobacco Use  Smoking Status Never Smoker  Smokeless Tobacco Never Used    Goals Met:  Independence with exercise equipment Exercise tolerated well No report of cardiac concerns or symptoms Strength training completed today  Goals Unmet:  Not Applicable  Comments: First full day of exercise!  Patient was oriented to gym and equipment including functions, settings, policies, and procedures.  Patient's individual exercise prescription and treatment plan were reviewed.  All starting workloads were established based on the results of the 6 minute walk test done at initial orientation visit.  The plan for exercise progression was also introduced and progression will be customized based on patient's performance and goals.    Dr. Emily Filbert is Medical Director for [facility].

## 2017-05-12 ENCOUNTER — Encounter: Payer: Medicare HMO | Admitting: *Deleted

## 2017-05-12 ENCOUNTER — Encounter: Payer: Self-pay | Admitting: *Deleted

## 2017-05-12 DIAGNOSIS — I213 ST elevation (STEMI) myocardial infarction of unspecified site: Secondary | ICD-10-CM

## 2017-05-12 DIAGNOSIS — Z955 Presence of coronary angioplasty implant and graft: Secondary | ICD-10-CM | POA: Diagnosis not present

## 2017-05-12 DIAGNOSIS — I10 Essential (primary) hypertension: Secondary | ICD-10-CM | POA: Diagnosis not present

## 2017-05-12 DIAGNOSIS — Z87442 Personal history of urinary calculi: Secondary | ICD-10-CM | POA: Diagnosis not present

## 2017-05-12 NOTE — Progress Notes (Signed)
Daily Session Note  Patient Details  Name: ZYKIRIA BRUENING MRN: [id number] Date of Birth: 18-Jun-1950 Referring Provider:     Cardiac Rehab from 05/03/2017 in [facility] Cardiac and Pulmonary Rehab  Referring Provider  Martinique, Peter MD      Encounter Date: 05/12/2017  Check In: Session Check In - 05/12/17 1640      Check-In   Location  [facility]-Cardiac & Pulmonary Rehab    Staff Present  Renita Papa, RN Vickki Hearing, BA, ACSM CEP, Exercise Physiologist;Katalina Magri, RN, BSN    Supervising physician immediately available to respond to emergencies  See telemetry face sheet for immediately available ER MD    Medication changes reported      No    Fall or balance concerns reported     No    Tobacco Cessation  No Change    Warm-up and Cool-down  Performed on first and last piece of equipment    Resistance Training Performed  Yes    VAD Patient?  No      Pain Assessment   Currently in Pain?  Yes        Exercise Prescription Changes - 05/12/17 1200      Response to Exercise   Blood Pressure (Admit)  126/64    Blood Pressure (Exercise)  142/80    Blood Pressure (Exit)  112/70    Heart Rate (Admit)  72 bpm    Heart Rate (Exercise)  96 bpm    Heart Rate (Exit)  69 bpm    Rating of Perceived Exertion (Exercise)  13    Symptoms  none    Duration  Progress to 45 minutes of aerobic exercise without signs/symptoms of physical distress    Intensity  THRR unchanged      Progression   Average METs  2.6      Resistance Training   Training Prescription  Yes    Weight  3 lb    Reps  10-15      Treadmill   MPH  2.1    Grade  0.5    Minutes  15    METs  2.9      NuStep   Level  1    SPM  80    Minutes  15    METs  2.4       Social History   Tobacco Use  Smoking Status Never Smoker  Smokeless Tobacco Never Used    Goals Met:  Proper associated with RPD/PD & O2 Sat Exercise tolerated well No report of cardiac concerns or symptoms Strength training completed  today  Goals Unmet:  Not Applicable  Comments:     Dr. Emily Filbert is Medical Director for [place].

## 2017-05-13 DIAGNOSIS — I10 Essential (primary) hypertension: Secondary | ICD-10-CM | POA: Diagnosis not present

## 2017-05-13 DIAGNOSIS — Z87442 Personal history of urinary calculi: Secondary | ICD-10-CM | POA: Diagnosis not present

## 2017-05-13 DIAGNOSIS — I213 ST elevation (STEMI) myocardial infarction of unspecified site: Secondary | ICD-10-CM | POA: Diagnosis not present

## 2017-05-13 DIAGNOSIS — Z955 Presence of coronary angioplasty implant and graft: Secondary | ICD-10-CM | POA: Diagnosis not present

## 2017-05-16 ENCOUNTER — Emergency Department (HOSPITAL_COMMUNITY): Payer: Medicare HMO

## 2017-05-16 ENCOUNTER — Encounter (HOSPITAL_COMMUNITY): Payer: Self-pay | Admitting: Emergency Medicine

## 2017-05-16 ENCOUNTER — Other Ambulatory Visit: Payer: Self-pay

## 2017-05-16 ENCOUNTER — Emergency Department (HOSPITAL_COMMUNITY)
Admission: EM | Admit: 2017-05-16 | Discharge: 2017-05-16 | Disposition: A | Discharge status: Home / Self Care | Payer: Medicare HMO | Attending: Emergency Medicine | Admitting: Emergency Medicine

## 2017-05-16 DIAGNOSIS — I10 Essential (primary) hypertension: Secondary | ICD-10-CM | POA: Diagnosis not present

## 2017-05-16 DIAGNOSIS — R319 Hematuria, unspecified: Secondary | ICD-10-CM

## 2017-05-16 DIAGNOSIS — N200 Calculus of kidney: Secondary | ICD-10-CM | POA: Diagnosis not present

## 2017-05-16 DIAGNOSIS — Z7901 Long term (current) use of anticoagulants: Secondary | ICD-10-CM | POA: Diagnosis not present

## 2017-05-16 DIAGNOSIS — Z79899 Other long term (current) drug therapy: Secondary | ICD-10-CM | POA: Insufficient documentation

## 2017-05-16 DIAGNOSIS — Z7982 Long term (current) use of aspirin: Secondary | ICD-10-CM | POA: Diagnosis not present

## 2017-05-16 DIAGNOSIS — R109 Unspecified abdominal pain: Secondary | ICD-10-CM | POA: Diagnosis not present

## 2017-05-16 LAB — URINALYSIS, ROUTINE W REFLEX MICROSCOPIC
BACTERIA UA: NONE SEEN
BILIRUBIN URINE: NEGATIVE
Glucose, UA: 50 mg/dL — AB
KETONES UR: 5 mg/dL — AB
LEUKOCYTES UA: NEGATIVE
NITRITE: NEGATIVE
PROTEIN: 100 mg/dL — AB
Specific Gravity, Urine: 1.013 (ref 1.005–1.030)
Squamous Epithelial / LPF: NONE SEEN
WBC UA: NONE SEEN WBC/hpf (ref 0–5)
pH: 6 (ref 5.0–8.0)

## 2017-05-16 LAB — CBC WITH DIFFERENTIAL/PLATELET
Basophils Absolute: 0 10*3/uL (ref 0.0–0.1)
Basophils Relative: 0 %
EOS ABS: 0 10*3/uL (ref 0.0–0.7)
EOS PCT: 0 %
HCT: 42.2 % (ref 36.0–46.0)
Hemoglobin: 14 g/dL (ref 12.0–15.0)
LYMPHS ABS: 1.6 10*3/uL (ref 0.7–4.0)
Lymphocytes Relative: 22 %
MCH: 30.4 pg (ref 26.0–34.0)
MCHC: 33.2 g/dL (ref 30.0–36.0)
MCV: 91.5 fL (ref 78.0–100.0)
MONOS PCT: 6 %
Monocytes Absolute: 0.4 10*3/uL (ref 0.1–1.0)
Neutro Abs: 5.4 10*3/uL (ref 1.7–7.7)
Neutrophils Relative %: 72 %
PLATELETS: 189 10*3/uL (ref 150–400)
RBC: 4.61 MIL/uL (ref 3.87–5.11)
RDW: 12.5 % (ref 11.5–15.5)
WBC: 7.5 10*3/uL (ref 4.0–10.5)

## 2017-05-16 LAB — BASIC METABOLIC PANEL
Anion gap: 10 (ref 5–15)
BUN: 12 mg/dL (ref 6–20)
CHLORIDE: 104 mmol/L (ref 101–111)
CO2: 25 mmol/L (ref 22–32)
CREATININE: 0.98 mg/dL (ref 0.44–1.00)
Calcium: 9.9 mg/dL (ref 8.9–10.3)
GFR calc Af Amer: >60 mL/min (ref >60)
GFR calc non Af Amer: 58 mL/min — ABNORMAL LOW (ref >60)
Glucose, Bld: 103 mg/dL — ABNORMAL HIGH (ref 65–99)
Potassium: 4.5 mmol/L (ref 3.5–5.1)
SODIUM: 139 mmol/L (ref 135–145)

## 2017-05-16 LAB — PROTIME-INR
INR: 1.05
PROTHROMBIN TIME: 13.6 s (ref 11.4–15.2)

## 2017-05-16 MED ORDER — CEPHALEXIN 500 MG PO CAPS: 1000.0000 mg | ORAL_CAPSULE | Freq: Two times a day (BID) | ORAL | 0 refills | Status: DC

## 2017-05-16 NOTE — ED Provider Notes (Signed)
[place] EMERGENCY DEPARTMENT Provider Note   CSN: [id number] Arrival date & time: 05/16/17  0345     History   Chief Complaint Chief Complaint  Patient presents with  . Hematuria    HPI Allison Sanders is a 67 y.o. female.  HPI Ports she developed blood in the urine yesterday.  It was a large amount.  She continued to have several more episodes of large amount of blood with urination.  Patient did notice some left lower back discomfort 2 days ago.  She reports it was not severe.  She does have history of kidney stone but did not think this back pain was severe enough to be a stone.  No fever no nausea no vomiting.  Patient has recently been started on Brilinta status post stent placement 2 months ago. Past Medical History:  Diagnosis Date  . HTN (hypertension)   . Kidney stone     Patient Active Problem List   Diagnosis Date Noted  . STEMI involving left anterior descending coronary artery ([place]) 04/05/2017  . HTN (hypertension) 04/05/2017  . Acute ST elevation myocardial infarction (STEMI) involving left anterior descending (LAD) coronary artery ([place]) 04/05/2017  . Carpal tunnel syndrome, bilateral upper limbs 10/20/2016  . Degenerative disc disease, lumbar 09/04/2015  . Low back pain 08/14/2015  . Arthritis of left lower extremity 11/19/2014  . Arthritis of right lower extremity 11/19/2014    Past Surgical History:  Procedure Laterality Date  . CORONARY STENT INTERVENTION N/A 04/05/2017   Procedure: CORONARY STENT INTERVENTION;  Surgeon: Martinique, Peter M, MD;  Location: [place] CV LAB;  Service: Cardiovascular;  Laterality: N/A;  . CORONARY/GRAFT ACUTE MI REVASCULARIZATION N/A 04/05/2017   Procedure: Coronary/Graft Acute MI Revascularization;  Surgeon: Martinique, Peter M, MD;  Location: [place] CV LAB;  Service: Cardiovascular;  Laterality: N/A;  . DILATION AND CURETTAGE, DIAGNOSTIC / THERAPEUTIC    . LEFT HEART CATH AND CORONARY ANGIOGRAPHY  N/A 04/05/2017   Procedure: LEFT HEART CATH AND CORONARY ANGIOGRAPHY;  Surgeon: Martinique, Peter M, MD;  Location: [place] CV LAB;  Service: Cardiovascular;  Laterality: N/A;  . left nephrectomy       OB History   None      Home Medications    Prior to Admission medications   Medication Sig Start Date End Date Taking? Authorizing Provider  aspirin 81 MG chewable tablet Chew 1 tablet (81 mg total) by mouth daily. 04/09/17   Kalman Shan Ratliff, DO  atorvastatin (LIPITOR) 80 MG tablet Take 1 tablet (80 mg total) by mouth daily at 6 PM. 04/08/17   Hoffman, Elza Rafter, DO  cephALEXin (KEFLEX) 500 MG capsule Take 2 capsules (1,000 mg total) by mouth 2 (two) times daily. 05/16/17   Charlesetta Shanks, MD  cholecalciferol (VITAMIN D) 1000 UNITS tablet Take 2,000 Units by mouth daily.    [provider]  irbesartan (AVAPRO) 75 MG tablet Take 1 tablet (75 mg total) by mouth daily. 04/09/17   Kalman Shan Ratliff, DO  metoprolol succinate (TOPROL-XL) 25 MG 24 hr tablet Take 1 tablet (25 mg total) by mouth at bedtime. 04/22/17   Almyra Deforest, PA  nitroGLYCERIN (NITROSTAT) 0.4 MG SL tablet Place 1 tablet (0.4 mg total) under the tongue every 5 (five) minutes x 3 doses as needed for chest pain. 04/08/17   Valinda Party, DO  spironolactone (ALDACTONE) 25 MG tablet Take 0.5 tablets (12.5 mg total) by mouth daily. 04/08/17   Valinda Party, DO  ticagrelor (  BRILINTA) 90 MG TABS tablet Take 1 tablet (90 mg total) by mouth 2 (two) times daily. 04/08/17   Valinda Party, DO  vitamin C (ASCORBIC ACID) 250 MG tablet Take 250 mg by mouth daily.    [provider]    Family History Family History  Problem Relation Age of Onset  . Heart attack Father     Social History Social History   Tobacco Use  . Smoking status: Never Smoker  . Smokeless tobacco: Never Used  Substance Use Topics  . Alcohol use: No    Alcohol/week: 0.0 oz    Frequency: Never  .  Drug use: No     Allergies   Penicillins; Sulfa antibiotics; and Contrast media [iodinated diagnostic agents]   Review of Systems Review of Systems 10 Systems reviewed and are negative for acute change except as noted in the HPI.   Physical Exam Updated Vital Signs BP 140/69 (BP Location: Right Arm)   Pulse 71   Temp 97.9 F (36.6 C) (Oral)   Resp 14   SpO2 100%   Physical Exam  Constitutional: She is oriented to person, place, and time. She appears well-developed and well-nourished. No distress.  HENT:  Head: Normocephalic and atraumatic.  Eyes: Conjunctivae are normal.  Neck: Neck supple.  Cardiovascular: Normal rate, regular rhythm and normal heart sounds.  No murmur heard. Pulmonary/Chest: Effort normal and breath sounds normal. No respiratory distress.  Abdominal: Soft. She exhibits no distension. There is no tenderness. There is no guarding.  No CVA tenderness.  Musculoskeletal: Normal range of motion. She exhibits no edema or tenderness.  Neurological: She is alert and oriented to person, place, and time. No cranial nerve deficit. She exhibits normal muscle tone. Coordination normal.  Skin: Skin is warm and dry.  Psychiatric: She has a normal mood and affect.  Nursing note and vitals reviewed.    ED Treatments / Results  Labs (all labs ordered are listed, but only abnormal results are displayed) Labs Reviewed  URINALYSIS, ROUTINE W REFLEX MICROSCOPIC - Abnormal; Notable for the following components:      Result Value   Color, Urine AMBER (*)    APPearance HAZY (*)    Glucose, UA 50 (*)    Hgb urine dipstick MODERATE (*)    Ketones, ur 5 (*)    Protein, ur 100 (*)    All other components within normal limits  BASIC METABOLIC PANEL - Abnormal; Notable for the following components:   Glucose, Bld 103 (*)    GFR calc non Af Amer 58 (*)    All other components within normal limits  URINE CULTURE  CBC WITH DIFFERENTIAL/PLATELET  PROTIME-INR     EKG None  Radiology Ct Renal Stone Study  Result Date: 05/16/2017 CLINICAL DATA:  LEFT flank pain for 3 days, hematuria, on anticoagulant therapy, history of kidney stones and hypertension EXAM: CT ABDOMEN AND PELVIS WITHOUT CONTRAST TECHNIQUE: Multidetector CT imaging of the abdomen and pelvis was performed following the standard protocol without IV contrast. Sagittal and coronal MPR images reconstructed from axial data set. Oral contrast was not administered. COMPARISON:  None FINDINGS: Lower chest: Lung bases clear Hepatobiliary: Gallbladder and liver normal appearance Pancreas: Pancreatic atrophy without mass Spleen: Single calcified granuloma in spleen. Spleen otherwise unremarkable. Adrenals/Urinary Tract: Adrenal glands normal appearance. Prior RIGHT nephrectomy. Nonobstructing LEFT renal calculi up to 4 mm diameter. No LEFT hydronephrosis or hydroureter. Bladder unremarkable. Bladder unremarkable. Stomach/Bowel: Normal appendix. Stomach and bowel loops normal appearance. Vascular/Lymphatic: Atherosclerotic  calcifications aorta without aneurysm. No adenopathy. Reproductive: Atrophic uterus.  Unremarkable adnexa. Other: No free air or free fluid. No acute inflammatory process. Atrophic RIGHT anterior and lateral abdominal wall musculature. Musculoskeletal: No acute osseous findings. IMPRESSION: Nonobstructing LEFT renal calculi. Prior RIGHT nephrectomy. No acute intra-abdominal or intrapelvic abnormalities. Electronically Signed   By: Lavonia Dana M.D.   On: 05/16/2017 13:15    Procedures Procedures (including critical care time)  Medications Ordered in ED Medications - No data to display   Initial Impression / Assessment and Plan / ED Course  I have reviewed the triage vital signs and the nursing notes.  Pertinent labs & imaging results that were available during my care of the patient were reviewed by me and considered in my medical decision making (see chart for details).        Final Clinical Impressions(s) / ED Diagnoses   Final diagnoses:  Hematuria, unspecified type  Kidney stone  Anticoagulated  Patient has developed gross hematuria.  She is taking Brilinta for a stent 2 months ago.  Renal function is normal.  CT shows an intraparenchymal stone but no obstructing stones.  Patient does not have significant associated pain, no fever, no nausea or vomiting.  At this time will empirically start Keflex for possible UTI.  Patient is aware of the need to follow-up with urology for possible cystoscopy if indicated.  Patient has stable hemoglobin and vital signs.  No signs of anemia.  She will continue the Brilinta and discuss onset of urinary bleeding with her cardiologist and PCP.  She is discharged in good condition.  ED Discharge Orders        Ordered    cephALEXin (KEFLEX) 500 MG capsule  2 times daily     05/16/17 1337       Charlesetta Shanks, MD 05/16/17 1339

## 2017-05-16 NOTE — ED Notes (Signed)
Label sent down to main lab for urine culture.

## 2017-05-16 NOTE — ED Triage Notes (Addendum)
Pt reports blood in urine X2 tonight.  Denies pain while urinating.  Pt has lower left back soreness earlier in the week.  Denies any other symptoms.  Pt is on blood thinners

## 2017-05-16 NOTE — ED Triage Notes (Signed)
PT reports having pressure to LT flank  several times.

## 2017-05-16 NOTE — ED Notes (Signed)
Pt verbalized understanding of all d/c instructions, prescriptions, and f/u information. VSS. All belongings with patient at this time. Pt ambulatory to lobby with steady gait.

## 2017-05-16 NOTE — Discharge Instructions (Addendum)
1.  Call your family doctor and cardiologist on Monday to discuss your bleeding and Brilinta. 2.  Continue your Brilinta until told otherwise.  Your blood counts are normal today. 3.  Start Keflex for possible urinary tract infection and schedule a follow-up appointment with a urologist.

## 2017-05-16 NOTE — ED Notes (Signed)
Patient transported to CT 

## 2017-05-17 ENCOUNTER — Telehealth: Payer: Self-pay | Admitting: *Deleted

## 2017-05-17 ENCOUNTER — Telehealth: Payer: Self-pay | Admitting: Cardiology

## 2017-05-17 LAB — URINE CULTURE: Culture: NO GROWTH

## 2017-05-17 NOTE — Telephone Encounter (Signed)
Returned call to patient she stated she wanted to let Dr.Jordan know she went to [facility]  ED Sunday 05/16/17 for bright red blood in urine.Stated she has a UTI and a kidney stone.Stated she was prescribed antibiotics.Appointment was scheduled with urology.Stated bleeding has stopped and she is feeling better.Advised I will make Dr.Jordan aware.

## 2017-05-17 NOTE — Telephone Encounter (Signed)
New Message:     Please call,Pt said she had to go to [facility] ER yesterday.They told her to call and let somebody know about her visit to the ER.

## 2017-05-17 NOTE — Telephone Encounter (Signed)
Allison Sanders called to say she was not going to make it to class tonight and stated that she ws in the hospital recently with a UTI and is now on antibiotics and wants to give them a few days to kick in. She plans to come to the next scheduled class on Wednesday.

## 2017-05-19 ENCOUNTER — Encounter: Payer: Self-pay | Admitting: *Deleted

## 2017-05-19 DIAGNOSIS — Z87442 Personal history of urinary calculi: Secondary | ICD-10-CM | POA: Diagnosis not present

## 2017-05-19 DIAGNOSIS — I2510 Atherosclerotic heart disease of native coronary artery without angina pectoris: Secondary | ICD-10-CM | POA: Diagnosis not present

## 2017-05-19 DIAGNOSIS — I213 ST elevation (STEMI) myocardial infarction of unspecified site: Secondary | ICD-10-CM

## 2017-05-19 DIAGNOSIS — Z955 Presence of coronary angioplasty implant and graft: Secondary | ICD-10-CM | POA: Diagnosis not present

## 2017-05-19 DIAGNOSIS — I10 Essential (primary) hypertension: Secondary | ICD-10-CM | POA: Diagnosis not present

## 2017-05-19 DIAGNOSIS — I255 Ischemic cardiomyopathy: Secondary | ICD-10-CM | POA: Diagnosis not present

## 2017-05-19 DIAGNOSIS — N200 Calculus of kidney: Secondary | ICD-10-CM | POA: Diagnosis not present

## 2017-05-19 DIAGNOSIS — R319 Hematuria, unspecified: Secondary | ICD-10-CM | POA: Diagnosis not present

## 2017-05-19 NOTE — Progress Notes (Signed)
Daily Session Note  Patient Details  Name: Caylan A Jeffries MRN: [id number] Date of Birth: 01/21/1951 Referring Provider:     Cardiac Rehab from 05/03/2017 in [facility]  Referring Provider  Jordan, Peter MD      Encounter Date: 05/19/2017  Check In: Session Check In - 05/19/17 1735      Check-In   Location  [facility]    Staff Present  Meredith Craven, RN BSN;Amanda Sommer, BA, ACSM CEP, Exercise Physiologist;Carroll Enterkin, RN, BSN    Supervising physician immediately available to respond to emergencies  See telemetry face sheet for immediately available ER MD    Medication changes reported      No    Fall or balance concerns reported     No    Warm-up and Cool-down  Performed on first and last piece of equipment    Resistance Training Performed  Yes    VAD Patient?  No      Pain Assessment   Currently in Pain?  No/denies    Multiple Pain Sites  No          Social History   Tobacco Use  Smoking Status Never Smoker  Smokeless Tobacco Never Used    Goals Met:  Independence with exercise equipment Exercise tolerated well No report of cardiac concerns or symptoms Strength training completed today  Goals Unmet:  Not Applicable  Comments: Pt able to follow exercise prescription today without complaint.  Will continue to monitor for progression.    Dr. Mark Miller is Medical Director for [facility] and [facility]. 

## 2017-05-19 NOTE — Progress Notes (Signed)
Cardiac Individual Treatment Plan  Patient Details  Name: Allison Sanders MRN: [id number] Date of Birth: 06-22-1950 Referring Provider:     Cardiac Rehab from 05/03/2017 in [facility] Cardiac and Pulmonary Rehab  Referring Provider  Martinique, Peter MD      Initial Encounter Date:    Cardiac Rehab from 05/03/2017 in [facility] Cardiac and Pulmonary Rehab  Date  05/03/17  Referring Provider  Martinique, Peter MD      Visit Diagnosis: ST elevation myocardial infarction (STEMI), unspecified artery [facility])  Status post coronary artery stent placement  Patient's Home Medications on Admission:  Current Outpatient Medications:  .  aspirin 81 MG chewable tablet, Chew 1 tablet (81 mg total) by mouth daily., Disp: 30 tablet, Rfl: 2 .  atorvastatin (LIPITOR) 80 MG tablet, Take 1 tablet (80 mg total) by mouth daily at 6 PM., Disp: 30 tablet, Rfl: 2 .  cephALEXin (KEFLEX) 500 MG capsule, Take 2 capsules (1,000 mg total) by mouth 2 (two) times daily., Disp: 28 capsule, Rfl: 0 .  cholecalciferol (VITAMIN D) 1000 UNITS tablet, Take 2,000 Units by mouth daily., Disp: , Rfl:  .  irbesartan (AVAPRO) 75 MG tablet, Take 1 tablet (75 mg total) by mouth daily., Disp: 30 tablet, Rfl: 2 .  metoprolol succinate (TOPROL-XL) 25 MG 24 hr tablet, Take 1 tablet (25 mg total) by mouth at bedtime., Disp: 30 tablet, Rfl: 2 .  nitroGLYCERIN (NITROSTAT) 0.4 MG SL tablet, Place 1 tablet (0.4 mg total) under the tongue every 5 (five) minutes x 3 doses as needed for chest pain., Disp: 30 tablet, Rfl: 2 .  spironolactone (ALDACTONE) 25 MG tablet, Take 0.5 tablets (12.5 mg total) by mouth daily., Disp: 30 tablet, Rfl: 2 .  ticagrelor (BRILINTA) 90 MG TABS tablet, Take 1 tablet (90 mg total) by mouth 2 (two) times daily., Disp: 60 tablet, Rfl: 11 .  vitamin C (ASCORBIC ACID) 250 MG tablet, Take 250 mg by mouth daily., Disp: , Rfl:   Past Medical History: Past Medical History:  Diagnosis Date  . HTN (hypertension)   . Kidney stone      Tobacco Use: Social History   Tobacco Use  Smoking Status Never Smoker  Smokeless Tobacco Never Used    Labs: Recent Review Flowsheet Data    Labs for ITP Cardiac and Pulmonary Rehab Latest Ref Rng & Units 04/05/2017 04/05/2017 04/05/2017   Cholestrol 0 - 200 mg/dL 175 - 167   LDLCALC 0 - 99 mg/dL 95 - 91   HDL >40 mg/dL 53 - 51   Trlycerides <150 mg/dL 137 - 123   Hemoglobin A1c 4.8 - 5.6 % - - 5.7(H)   TCO2 22 - 32 mmol/L - 24 -       Exercise Target Goals:    Exercise Program Goal: Individual exercise prescription set using results from initial 6 min walk test and THRR while considering  patient's activity barriers and safety.   Exercise Prescription Goal: Initial exercise prescription builds to 30-45 minutes a day of aerobic activity, 2-3 days per week.  Home exercise guidelines will be given to patient during program as part of exercise prescription that the participant will acknowledge.  Activity Barriers & Risk Stratification: Activity Barriers & Cardiac Risk Stratification - 05/03/17 1333      Activity Barriers & Cardiac Risk Stratification   Activity Barriers  Other (comment)    Comments  Has bladder concerns and was instructed by her physician not to lift. She  voiced that she is aware to  not do any exercise that she feels may hurt her bladder condition.        6 Minute Walk: 6 Minute Walk    Row Name 05/03/17 1519         6 Minute Walk   Phase  Initial     Distance  1115 feet     Walk Time  6 minutes     # of Rest Breaks  0     MPH  2.11     METS  2.79     RPE  10     VO2 Peak  9.76     Symptoms  No     Resting HR  56 bpm     Resting BP  126/82     Resting Oxygen Saturation   100 %     Exercise Oxygen Saturation  during 6 min walk  100 %     Max Ex. HR  107 bpm     Max Ex. BP  150/68     2 Minute Post BP  124/70        Oxygen Initial Assessment:   Oxygen Re-Evaluation:   Oxygen Discharge (Final Oxygen Re-Evaluation):   Initial  Exercise Prescription: Initial Exercise Prescription - 05/03/17 1500      Date of Initial Exercise RX and Referring Provider   Date  05/03/17    Referring Provider  Martinique, Peter MD      Treadmill   MPH  2.1    Grade  0.5    Minutes  15    METs  2.75      NuStep   Level  1    SPM  80    Minutes  15    METs  2.7      Arm Ergometer   Level  1    Watts  38    RPM  25    Minutes  15    METs  2.79      Prescription Details   Frequency (times per week)  3    Duration  Progress to 45 minutes of aerobic exercise without signs/symptoms of physical distress      Intensity   THRR 40-80% of Max Heartrate  95-134    Ratings of Perceived Exertion  11-13    Perceived Dyspnea  0-4      Progression   Progression  Continue to progress workloads to maintain intensity without signs/symptoms of physical distress.      Resistance Training   Training Prescription  Yes    Weight  3    Reps  10-15       Perform Capillary Blood Glucose checks as needed.  Exercise Prescription Changes: Exercise Prescription Changes    Row Name 05/03/17 1500 05/12/17 1200           Response to Exercise   Blood Pressure (Admit)  126/82  126/64      Blood Pressure (Exercise)  150/68  142/80      Blood Pressure (Exit)  124/70  112/70      Heart Rate (Admit)  56 bpm  72 bpm      Heart Rate (Exercise)  107 bpm  96 bpm      Heart Rate (Exit)  62 bpm  69 bpm      Oxygen Saturation (Admit)  100 %  -      Oxygen Saturation (Exercise)  100 %  -      Rating of Perceived Exertion (Exercise)  10  13      Symptoms  none  none      Comments  walk test results  -      Duration  -  Progress to 45 minutes of aerobic exercise without signs/symptoms of physical distress      Intensity  -  THRR unchanged        Progression   Average METs  -  2.6        Resistance Training   Training Prescription  -  Yes      Weight  -  3 lb      Reps  -  10-15        Treadmill   MPH  -  2.1      Grade  -  0.5       Minutes  -  15      METs  -  2.9        NuStep   Level  -  1      SPM  -  80      Minutes  -  15      METs  -  2.4         Exercise Comments: Exercise Comments    Row Name 05/10/17 1716           Exercise Comments  First full day of exercise!  Patient was oriented to gym and equipment including functions, settings, policies, and procedures.  Patient's individual exercise prescription and treatment plan were reviewed.  All starting workloads were established based on the results of the 6 minute walk test done at initial orientation visit.  The plan for exercise progression was also introduced and progression will be customized based on patient's performance and goals.          Exercise Goals and Review: Exercise Goals    Row Name 05/03/17 1523             Exercise Goals   Increase Physical Activity  Yes       Intervention  Provide advice, education, support and counseling about physical activity/exercise needs.;Develop an individualized exercise prescription for aerobic and resistive training based on initial evaluation findings, risk stratification, comorbidities and participant's personal goals.       Expected Outcomes  Short Term: Attend rehab on a regular basis to increase amount of physical activity.;Long Term: Add in home exercise to make exercise part of routine and to increase amount of physical activity.;Long Term: Exercising regularly at least 3-5 days a week.       Increase Strength and Stamina  Yes       Intervention  Provide advice, education, support and counseling about physical activity/exercise needs.;Develop an individualized exercise prescription for aerobic and resistive training based on initial evaluation findings, risk stratification, comorbidities and participant's personal goals.       Expected Outcomes  Short Term: Increase workloads from initial exercise prescription for resistance, speed, and METs.;Long Term: Improve cardiorespiratory fitness, muscular  endurance and strength as measured by increased METs and functional capacity (6MWT);Short Term: Perform resistance training exercises routinely during rehab and add in resistance training at home       Able to understand and use rate of perceived exertion (RPE) scale  Yes       Intervention  Provide education and explanation on how to use RPE scale       Expected Outcomes  Short Term: Able to use RPE daily in rehab to express subjective  intensity level;Long Term:  Able to use RPE to guide intensity level when exercising independently       Knowledge and understanding of Target Heart Rate Range (THRR)  Yes       Intervention  Provide education and explanation of THRR including how the numbers were predicted and where they are located for reference       Expected Outcomes  Short Term: Able to state/look up THRR;Short Term: Able to use daily as guideline for intensity in rehab;Long Term: Able to use THRR to govern intensity when exercising independently       Able to check pulse independently  Yes       Intervention  Provide education and demonstration on how to check pulse in carotid and radial arteries.;Review the importance of being able to check your own pulse for safety during independent exercise       Expected Outcomes  Short Term: Able to explain why pulse checking is important during independent exercise;Long Term: Able to check pulse independently and accurately       Understanding of Exercise Prescription  Yes       Intervention  Provide education, explanation, and written materials on patient's individual exercise prescription       Expected Outcomes  Short Term: Able to explain program exercise prescription;Long Term: Able to explain home exercise prescription to exercise independently          Exercise Goals Re-Evaluation : Exercise Goals Re-Evaluation    Row Name 05/10/17 1717             Exercise Goal Re-Evaluation   Exercise Goals Review  Increase Physical Activity;Increase  Strength and Stamina;Able to understand and use rate of perceived exertion (RPE) scale;Knowledge and understanding of Target Heart Rate Range (THRR)       Comments  Reviewed RPE scale, THR and program prescription with pt today.  Pt voiced understanding and was given a copy of goals to take home.       Expected Outcomes  Short: Use RPE daily to regulate intensity.  Long: Follow program prescription in THR.          Discharge Exercise Prescription (Final Exercise Prescription Changes): Exercise Prescription Changes - 05/12/17 1200      Response to Exercise   Blood Pressure (Admit)  126/64    Blood Pressure (Exercise)  142/80    Blood Pressure (Exit)  112/70    Heart Rate (Admit)  72 bpm    Heart Rate (Exercise)  96 bpm    Heart Rate (Exit)  69 bpm    Rating of Perceived Exertion (Exercise)  13    Symptoms  none    Duration  Progress to 45 minutes of aerobic exercise without signs/symptoms of physical distress    Intensity  THRR unchanged      Progression   Average METs  2.6      Resistance Training   Training Prescription  Yes    Weight  3 lb    Reps  10-15      Treadmill   MPH  2.1    Grade  0.5    Minutes  15    METs  2.9      NuStep   Level  1    SPM  80    Minutes  15    METs  2.4       Nutrition:  Target Goals: Understanding of nutrition guidelines, daily intake of sodium '1500mg'$ , cholesterol '200mg'$ , calories 30% from fat  and 7% or less from saturated fats, daily to have 5 or more servings of fruits and vegetables.  Biometrics: Pre Biometrics - 05/03/17 1524      Pre Biometrics   Height  '5\' 5"'$  (1.651 m)    Weight  179 lb 12.8 oz (81.6 kg)    Waist Circumference  33 inches    Hip Circumference  45 inches    Waist to Hip Ratio  0.73 %    BMI (Calculated)  29.92    Single Leg Stand  25.25 seconds        Nutrition Therapy Plan and Nutrition Goals: Nutrition Therapy & Goals - 05/03/17 1308      Intervention Plan   Intervention  Prescribe, educate and  counsel regarding individualized specific dietary modifications aiming towards targeted core components such as weight, hypertension, lipid management, diabetes, heart failure and other comorbidities.    Expected Outcomes  Short Term Goal: Understand basic principles of dietary content, such as calories, fat, sodium, cholesterol and nutrients.;Short Term Goal: A plan has been developed with personal nutrition goals set during dietitian appointment.;Long Term Goal: Adherence to prescribed nutrition plan.       Nutrition Assessments: Nutrition Assessments - 05/03/17 1308      MEDFICTS Scores   Pre Score  3       Nutrition Goals Re-Evaluation:   Nutrition Goals Discharge (Final Nutrition Goals Re-Evaluation):   Psychosocial: Target Goals: Acknowledge presence or absence of significant depression and/or stress, maximize coping skills, provide positive support system. Participant is able to verbalize types and ability to use techniques and skills needed for reducing stress and depression.   Initial Review & Psychosocial Screening: Initial Psych Review & Screening - 05/03/17 1324      Initial Review   Current issues with  Current Anxiety/Panic;Current Stress Concerns    Source of Stress Concerns  Financial;Family    Comments  Cares for her  69 yo son at home. he is a quadriplegic.  Is in process of a divorce.     Financial concerns with divorce and medical bills.      Family Dynamics   Good Support System?  Yes Family and Friends      Barriers   Psychosocial barriers to participate in program  There are no identifiable barriers or psychosocial needs.;The patient should benefit from training in stress management and relaxation.      Screening Interventions   Interventions  Encouraged to exercise;Provide feedback about the scores to participant;To provide support and resources with identified psychosocial needs    Expected Outcomes  Short Term goal: Utilizing psychosocial counselor,  staff and physician to assist with identification of specific Stressors or current issues interfering with healing process. Setting desired goal for each stressor or current issue identified.;Long Term Goal: Stressors or current issues are controlled or eliminated.;Short Term goal: Identification and review with participant of any Quality of Life or Depression concerns found by scoring the questionnaire.;Long Term goal: The participant improves quality of Life and PHQ9 Scores as seen by post scores and/or verbalization of changes       Quality of Life Scores:  Quality of Life - 05/03/17 1329      Quality of Life Scores   Health/Function Pre  25.5 %    Socioeconomic Pre  22.83 %    Psych/Spiritual Pre  24.21 %    Family Pre  27 %    GLOBAL Pre  24.89 %      Scores of 19 and below  usually indicate a poorer quality of life in these areas.  A difference of  2-3 points is a clinically meaningful difference.  A difference of 2-3 points in the total score of the Quality of Life Index has been associated with significant improvement in overall quality of life, self-image, physical symptoms, and general health in studies assessing change in quality of life.  PHQ-9: Recent Review Flowsheet Data    Depression screen [facility] 2/9 05/03/2017   Decreased Interest 0   Down, Depressed, Hopeless 0   PHQ - 2 Score 0   Altered sleeping 0   Change in appetite 0   Feeling bad or failure about yourself  0   Trouble concentrating 0   Moving slowly or fidgety/restless 0   Suicidal thoughts 0   PHQ-9 Score 0   Difficult doing work/chores Not difficult at all     Interpretation of Total Score  Total Score Depression Severity:  1-4 = Minimal depression, 5-9 = Mild depression, 10-14 = Moderate depression, 15-19 = Moderately severe depression, 20-27 = Severe depression   Psychosocial Evaluation and Intervention: Psychosocial Evaluation - 05/10/17 1657      Psychosocial Evaluation & Interventions    Interventions  Encouraged to exercise with the program and follow exercise prescription;Relaxation education;Stress management education    Comments  Counselor met with Allison Sanders) today for initial psychsocial evaluation.  She is a 67 year old who had a heart attack on 2/11 with one stent inserted.  Analei has a strong support system with family who live close by; a son who lives with her and her local church community.  She sleeps well and has a good appetite - although is "challenged" with what she can eat now since her heart attack.   Gloriajean denies a history of depression or anxiety or any current symptoms.  She has a great deal of stress in her life as she is going through a "sticky divorce" the past 2 years; finances are tight; her own health; her son had an accident 43 years ago and is a quadriplegic and Yzabelle's mother and grandmother have both passed away in the past 4 years.  Her goals for this program are "to be as healthy as possible".  Counselor educated Allison Sanders on the benefits of exercise for stress and commended her on her commitment to self-care and her recovery.  Staff will follow her.    Expected Outcomes  Cailah will benefit from consistent exercise to achieve her stated goals.   The educational and psychoeducational components of this program will be beneficial as well to understand and manage more positively.  She will exercise for stress management specifically.    Continue Psychosocial Services   Follow up required by staff       Psychosocial Re-Evaluation:   Psychosocial Discharge (Final Psychosocial Re-Evaluation):   Vocational Rehabilitation: Provide vocational rehab assistance to qualifying candidates.   Vocational Rehab Evaluation & Intervention: Vocational Rehab - 05/03/17 1333      Initial Vocational Rehab Evaluation & Intervention   Assessment shows need for Vocational Rehabilitation  No       Education: Education Goals: Education classes  will be provided on a variety of topics geared toward better understanding of heart health and risk factor modification. Participant will state understanding/return demonstration of topics presented as noted by education test scores.  Learning Barriers/Preferences: Learning Barriers/Preferences - 05/03/17 1332      Learning Barriers/Preferences   Learning Barriers  None    Learning Preferences  None       Education Topics:  AED/CPR: - Group verbal and written instruction with the use of models to demonstrate the basic use of the AED with the basic ABC's of resuscitation.   General Nutrition Guidelines/Fats and Fiber: -Group instruction provided by verbal, written material, models and posters to present the general guidelines for heart healthy nutrition. Gives an explanation and review of dietary fats and fiber.   Controlling Sodium/Reading Food Labels: -Group verbal and written material supporting the discussion of sodium use in heart healthy nutrition. Review and explanation with models, verbal and written materials for utilization of the food label.   Exercise Physiology & General Exercise Guidelines: - Group verbal and written instruction with models to review the exercise physiology of the cardiovascular system and associated critical values. Provides general exercise guidelines with specific guidelines to those with heart or lung disease.    Aerobic Exercise & Resistance Training: - Gives group verbal and written instruction on the various components of exercise. Focuses on aerobic and resistive training programs and the benefits of this training and how to safely progress through these programs..   Flexibility, Balance, Mind/Body Relaxation: Provides group verbal/written instruction on the benefits of flexibility and balance training, including mind/body exercise modes such as yoga, pilates and tai chi.  Demonstration and skill practice provided.   Stress and Anxiety: -  Provides group verbal and written instruction about the health risks of elevated stress and causes of high stress.  Discuss the correlation between heart/lung disease and anxiety and treatment options. Review healthy ways to manage with stress and anxiety.   Depression: - Provides group verbal and written instruction on the correlation between heart/lung disease and depressed mood, treatment options, and the stigmas associated with seeking treatment.   Anatomy & Physiology of the Heart: - Group verbal and written instruction and models provide basic cardiac anatomy and physiology, with the coronary electrical and arterial systems. Review of Valvular disease and Heart Failure   Cardiac Rehab from 05/12/2017 in [facility] Cardiac and Pulmonary Rehab  Date  05/10/17  Educator  CE  Instruction Review Code  1- Verbalizes Understanding      Cardiac Procedures: - Group verbal and written instruction to review commonly prescribed medications for heart disease. Reviews the medication, class of the drug, and side effects. Includes the steps to properly store meds and maintain the prescription regimen. (beta blockers and nitrates)   Cardiac Medications I: - Group verbal and written instruction to review commonly prescribed medications for heart disease. Reviews the medication, class of the drug, and side effects. Includes the steps to properly store meds and maintain the prescription regimen.   Cardiac Medications II: -Group verbal and written instruction to review commonly prescribed medications for heart disease. Reviews the medication, class of the drug, and side effects. (all other drug classes)    Go Sex-Intimacy & Heart Disease, Get SMART - Goal Setting: - Group verbal and written instruction through game format to discuss heart disease and the return to sexual intimacy. Provides group verbal and written material to discuss and apply goal setting through the application of the S.M.A.R.T.  Method.   Other Matters of the Heart: - Provides group verbal, written materials and models to describe Stable Angina and Peripheral Artery. Includes description of the disease process and treatment options available to the cardiac patient.   Exercise & Equipment Safety: - Individual verbal instruction and demonstration of equipment use and safety with use of the equipment.   Cardiac Rehab  from 05/12/2017 in [facility] Cardiac and Pulmonary Rehab  Date  05/03/17  Educator  [facility]  Instruction Review Code  1- Verbalizes Understanding      Infection Prevention: - Provides verbal and written material to individual with discussion of infection control including proper hand washing and proper equipment cleaning during exercise session.   Cardiac Rehab from 05/12/2017 in [facility] Cardiac and Pulmonary Rehab  Date  05/03/17  Educator  [facility]  Instruction Review Code  1- Verbalizes Understanding      Falls Prevention: - Provides verbal and written material to individual with discussion of falls prevention and safety.   Cardiac Rehab from 05/12/2017 in [facility] Cardiac and Pulmonary Rehab  Date  05/03/17  Educator  [facility]  Instruction Review Code  1- Verbalizes Understanding      Diabetes: - Individual verbal and written instruction to review signs/symptoms of diabetes, desired ranges of glucose level fasting, after meals and with exercise. Acknowledge that pre and post exercise glucose checks will be done for 3 sessions at entry of program.   Know Your Numbers and Risk Factors: -Group verbal and written instruction about important numbers in your health.  Discussion of what are risk factors and how they play a role in the disease process.  Review of Cholesterol, Blood Pressure, Diabetes, and BMI and the role they play in your overall health.   Sleep Hygiene: -Provides group verbal and written instruction about how sleep can affect your health.  Define sleep hygiene, discuss sleep cycles and impact of sleep  habits. Review good sleep hygiene tips.    Cardiac Rehab from 05/12/2017 in [facility] Cardiac and Pulmonary Rehab  Date  05/12/17  Educator  Lucianne Lei, MSW  Instruction Review Code  2- Demonstrated Understanding      Other: -Provides group and verbal instruction on various topics (see comments)   Knowledge Questionnaire Score: Knowledge Questionnaire Score - 05/03/17 1332      Knowledge Questionnaire Score   Pre Score  25/28 Reviewed correct responses with Mardene Celeste. She verbalized understanding of the correct responses and had no further questions today       Core Components/Risk Factors/Patient Goals at Admission: Personal Goals and Risk Factors at Admission - 05/03/17 1308      Core Components/Risk Factors/Patient Goals on Admission    Weight Management  Obesity;Yes    Intervention  Weight Management: Develop a combined nutrition and exercise program designed to reach desired caloric intake, while maintaining appropriate intake of nutrient and fiber, sodium and fats, and appropriate energy expenditure required for the weight goal.;Weight Management: Provide education and appropriate resources to help participant work on and attain dietary goals.;Weight Management/Obesity: Establish reasonable short term and long term weight goals.;Obesity: Provide education and appropriate resources to help participant work on and attain dietary goals.    Admit Weight  179 lb 12.8 oz (81.6 kg)    Goal Weight: Short Term  177 lb (80.3 kg)    Goal Weight: Long Term  155 lb (70.3 kg)    Expected Outcomes  Short Term: Continue to assess and modify interventions until short term weight is achieved;Weight Loss: Understanding of general recommendations for a balanced deficit meal plan, which promotes 1-2 lb weight loss per week and includes a negative energy balance of [id number] kcal/d    Hypertension  Yes    Intervention  Provide education on lifestyle modifcations including regular physical  activity/exercise, weight management, moderate sodium restriction and increased consumption of fresh fruit, vegetables, and low fat dairy, alcohol moderation,  and smoking cessation.;Monitor prescription use compliance.    Expected Outcomes  Short Term: Continued assessment and intervention until BP is < 140/6m HG in hypertensive participants. < 130/897mHG in hypertensive participants with diabetes, heart failure or chronic kidney disease.;Long Term: Maintenance of blood pressure at goal levels.    Lipids  Yes    Intervention  Provide education and support for participant on nutrition & aerobic/resistive exercise along with prescribed medications to achieve LDL '70mg'$ , HDL >'40mg'$ .    Expected Outcomes  Short Term: Participant states understanding of desired cholesterol values and is compliant with medications prescribed. Participant is following exercise prescription and nutrition guidelines.;Long Term: Cholesterol controlled with medications as prescribed, with individualized exercise RX and with personalized nutrition plan. Value goals: LDL < '70mg'$ , HDL > 40 mg.    Stress  Yes Cares for 3362o son at home. He is quadriplegic    Intervention  Offer individual and/or small group education and counseling on adjustment to heart disease, stress management and health-related lifestyle change. Teach and support self-help strategies.;Refer participants experiencing significant psychosocial distress to appropriate mental health specialists for further evaluation and treatment. When possible, include family members and significant others in education/counseling sessions.    Expected Outcomes  Short Term: Participant demonstrates changes in health-related behavior, relaxation and other stress management skills, ability to obtain effective social support, and compliance with psychotropic medications if prescribed.;Long Term: Emotional wellbeing is indicated by absence of clinically significant psychosocial distress or  social isolation.       Core Components/Risk Factors/Patient Goals Review:    Core Components/Risk Factors/Patient Goals at Discharge (Final Review):    ITP Comments: ITP Comments    Row Name 05/03/17 1302 05/19/17 0649         ITP Comments  Medical review completed today. ITP sent to Dr. MiSabra Hecko review, change as needed and to sign. Documentation of diagnosis can be foundin [facility] 04/05/2017 04/22/2017  30 Day review. Continue with ITP unless directed changes per Medical Director review.   New to program         Comments:

## 2017-05-20 DIAGNOSIS — Z955 Presence of coronary angioplasty implant and graft: Secondary | ICD-10-CM | POA: Diagnosis not present

## 2017-05-20 DIAGNOSIS — I10 Essential (primary) hypertension: Secondary | ICD-10-CM | POA: Diagnosis not present

## 2017-05-20 DIAGNOSIS — I213 ST elevation (STEMI) myocardial infarction of unspecified site: Secondary | ICD-10-CM

## 2017-05-20 DIAGNOSIS — Z87442 Personal history of urinary calculi: Secondary | ICD-10-CM | POA: Diagnosis not present

## 2017-05-20 NOTE — Progress Notes (Signed)
Daily Session Note  Patient Details  Name: Allison Sanders MRN: [id number] Date of Birth: 1950-12-26 Referring Provider:     Cardiac Rehab from 05/03/2017 in [facility] Cardiac and Pulmonary Rehab  Referring Provider  Martinique, Peter MD      Encounter Date: 05/20/2017  Check In: Session Check In - 05/20/17 1631      Check-In   Location  [facility]-Cardiac & Pulmonary Rehab    Staff Present  Justin Mend RCP,RRT,BSRT;Laureen Owens Shark, BS, RRT, Respiratory Therapist;Meredith Sherryll Burger, RN BSN    Supervising physician immediately available to respond to emergencies  See telemetry face sheet for immediately available ER MD    Medication changes reported      No    Fall or balance concerns reported     No    Tobacco Cessation  No Change    Warm-up and Cool-down  Performed as group-led instruction    Resistance Training Performed  Yes    VAD Patient?  No      Pain Assessment   Currently in Pain?  No/denies          Social History   Tobacco Use  Smoking Status Never Smoker  Smokeless Tobacco Never Used    Goals Met:  Independence with exercise equipment Exercise tolerated well No report of cardiac concerns or symptoms Strength training completed today  Goals Unmet:  Not Applicable  Comments: Pt able to follow exercise prescription today without complaint.  Will continue to monitor for progression.   Dr. Emily Filbert is Medical Director for [place] and [facility].

## 2017-05-24 ENCOUNTER — Encounter: Payer: Medicare HMO | Attending: Cardiology | Admitting: *Deleted

## 2017-05-24 DIAGNOSIS — I213 ST elevation (STEMI) myocardial infarction of unspecified site: Secondary | ICD-10-CM | POA: Insufficient documentation

## 2017-05-24 DIAGNOSIS — I10 Essential (primary) hypertension: Secondary | ICD-10-CM | POA: Diagnosis not present

## 2017-05-24 DIAGNOSIS — Z955 Presence of coronary angioplasty implant and graft: Secondary | ICD-10-CM | POA: Diagnosis not present

## 2017-05-24 DIAGNOSIS — Z87442 Personal history of urinary calculi: Secondary | ICD-10-CM | POA: Insufficient documentation

## 2017-05-24 NOTE — Progress Notes (Signed)
Daily Session Note  Patient Details  Name: Allison Sanders MRN: [id number] Date of Birth: November 05, 1950 Referring Provider:     Cardiac Rehab from 05/03/2017 in [facility] Cardiac and Pulmonary Rehab  Referring Provider  Martinique, Peter MD      Encounter Date: 05/24/2017  Check In: Session Check In - 05/24/17 1627      Check-In   Location  [facility]-Cardiac & Pulmonary Rehab    Staff Present  Earlean Shawl, BS, ACSM CEP, Exercise Physiologist;Amanda Oletta Darter, BA, ACSM CEP, Exercise Physiologist;Carroll Enterkin, RN, BSN    Supervising physician immediately available to respond to emergencies  See telemetry face sheet for immediately available ER MD    Medication changes reported      No    Fall or balance concerns reported     No    Warm-up and Cool-down  Performed on first and last piece of equipment    Resistance Training Performed  Yes    VAD Patient?  No      Pain Assessment   Currently in Pain?  No/denies    Multiple Pain Sites  No          Social History   Tobacco Use  Smoking Status Never Smoker  Smokeless Tobacco Never Used    Goals Met:  Independence with exercise equipment Exercise tolerated well No report of cardiac concerns or symptoms Strength training completed today  Goals Unmet:  Not Applicable  Comments: Pt able to follow exercise prescription today without complaint.  Will continue to monitor for progression.    Dr. Emily Filbert is Medical Director for [place] and [facility].

## 2017-05-26 ENCOUNTER — Encounter: Payer: Self-pay | Admitting: *Deleted

## 2017-05-26 ENCOUNTER — Other Ambulatory Visit: Payer: Self-pay | Admitting: Internal Medicine

## 2017-05-26 ENCOUNTER — Encounter: Payer: Medicare HMO | Admitting: *Deleted

## 2017-05-26 DIAGNOSIS — Z955 Presence of coronary angioplasty implant and graft: Secondary | ICD-10-CM

## 2017-05-26 DIAGNOSIS — I213 ST elevation (STEMI) myocardial infarction of unspecified site: Secondary | ICD-10-CM

## 2017-05-26 DIAGNOSIS — I10 Essential (primary) hypertension: Secondary | ICD-10-CM | POA: Diagnosis not present

## 2017-05-26 DIAGNOSIS — Z87442 Personal history of urinary calculi: Secondary | ICD-10-CM | POA: Diagnosis not present

## 2017-05-26 NOTE — Progress Notes (Signed)
Daily Session Note  Patient Details  Name: LADON HENEY MRN: [id number] Date of Birth: Jun 23, 1950 Referring Provider:     Cardiac Rehab from 05/03/2017 in [facility] Cardiac and Pulmonary Rehab  Referring Provider  Martinique, Peter MD      Encounter Date: 05/26/2017  Check In: Session Check In - 05/26/17 1640      Check-In   Location  [facility]-Cardiac & Pulmonary Rehab    Staff Present  Renita Papa, RN Vickki Hearing, BA, ACSM CEP, Exercise Physiologist;Evelyn Aguinaldo, RN, BSN    Supervising physician immediately available to respond to emergencies  See telemetry face sheet for immediately available ER MD    Medication changes reported      No    Fall or balance concerns reported     No    Tobacco Cessation  No Change    Warm-up and Cool-down  Performed on first and last piece of equipment    Resistance Training Performed  Yes    VAD Patient?  No      Pain Assessment   Currently in Pain?  No/denies          Social History   Tobacco Use  Smoking Status Never Smoker  Smokeless Tobacco Never Used    Goals Met:  Proper associated with RPD/PD & O2 Sat Independence with exercise equipment Exercise tolerated well No report of cardiac concerns or symptoms Strength training completed today  Goals Unmet:  Not Applicable  Comments:     Dr. Emily Filbert is Medical Director for [place] and [facility].

## 2017-05-27 ENCOUNTER — Other Ambulatory Visit: Payer: Self-pay | Admitting: Internal Medicine

## 2017-05-27 ENCOUNTER — Encounter: Payer: Medicare HMO | Admitting: *Deleted

## 2017-05-27 DIAGNOSIS — Z955 Presence of coronary angioplasty implant and graft: Secondary | ICD-10-CM | POA: Diagnosis not present

## 2017-05-27 DIAGNOSIS — I10 Essential (primary) hypertension: Secondary | ICD-10-CM | POA: Diagnosis not present

## 2017-05-27 DIAGNOSIS — Z87442 Personal history of urinary calculi: Secondary | ICD-10-CM | POA: Diagnosis not present

## 2017-05-27 DIAGNOSIS — I213 ST elevation (STEMI) myocardial infarction of unspecified site: Secondary | ICD-10-CM | POA: Diagnosis not present

## 2017-05-27 NOTE — Progress Notes (Signed)
Daily Session Note  Patient Details  Name: Allison Sanders MRN: [id number] Date of Birth: 1950-11-07 Referring Provider:     Cardiac Rehab from 05/03/2017 in [facility] Cardiac and Pulmonary Rehab  Referring Provider  Martinique, Peter MD      Encounter Date: 05/27/2017  Check In: Session Check In - 05/27/17 1659      Check-In   Location  [facility]-Cardiac & Pulmonary Rehab    Staff Present  Earlean Shawl, BS, ACSM CEP, Exercise Physiologist;Mary Kellie Shropshire, RN, BSN, Donavan Foil, RN, BSN, CCRP    Supervising physician immediately available to respond to emergencies  See telemetry face sheet for immediately available ER MD    Medication changes reported      No    Fall or balance concerns reported     No    Warm-up and Cool-down  Performed on first and last piece of equipment    Resistance Training Performed  Yes    VAD Patient?  No      Pain Assessment   Currently in Pain?  No/denies    Multiple Pain Sites  No        Exercise Prescription Changes - 05/27/17 1600      Response to Exercise   Duration  Progress to 45 minutes of aerobic exercise without signs/symptoms of physical distress    Intensity  THRR unchanged      Progression   Average METs  2.6      Resistance Training   Training Prescription  Yes    Weight  3 lb    Reps  10-15      Treadmill   MPH  2.1    Grade  0.5    Minutes  15    METs  2.9      NuStep   Level  1    SPM  80    Minutes  15    METs  2.4      Home Exercise Plan   Plans to continue exercise at  Home (comment) walk and do weight training on off days of class    Frequency  Add 2 additional days to program exercise sessions.    Initial Home Exercises Provided  05/27/17       Social History   Tobacco Use  Smoking Status Never Smoker  Smokeless Tobacco Never Used    Goals Met:  Independence with exercise equipment Exercise tolerated well Personal goals reviewed No report of cardiac concerns or symptoms Strength training completed  today  Goals Unmet:  Not Applicable  Comments: Pt able to follow exercise prescription today without complaint.  Will continue to monitor for progression.      Dr. Emily Filbert is Medical Director for [place] and [facility].

## 2017-05-31 ENCOUNTER — Encounter: Payer: Self-pay | Admitting: *Deleted

## 2017-05-31 ENCOUNTER — Encounter: Payer: Medicare HMO | Admitting: *Deleted

## 2017-05-31 DIAGNOSIS — Z955 Presence of coronary angioplasty implant and graft: Secondary | ICD-10-CM | POA: Diagnosis not present

## 2017-05-31 DIAGNOSIS — I213 ST elevation (STEMI) myocardial infarction of unspecified site: Secondary | ICD-10-CM

## 2017-05-31 DIAGNOSIS — Z87442 Personal history of urinary calculi: Secondary | ICD-10-CM | POA: Diagnosis not present

## 2017-05-31 DIAGNOSIS — I10 Essential (primary) hypertension: Secondary | ICD-10-CM | POA: Diagnosis not present

## 2017-05-31 NOTE — Progress Notes (Signed)
Daily Session Note  Patient Details  Name: Allison Sanders MRN: [id number] Date of Birth: [birth year] Referring Provider:     Cardiac Rehab from 05/03/2017 in [facility] Cardiac and Pulmonary Rehab  Referring Provider  Martinique, Peter MD      Encounter Date: 05/31/2017  Check In: Session Check In - 05/31/17 1658      Check-In   Location  [facility]-Cardiac & Pulmonary Rehab    Staff Present  Renita Papa, RN Moises Blood, BS, ACSM CEP, Exercise Physiologist;Akeyla Molden, RN, Vickki Hearing, BA, ACSM CEP, Exercise Physiologist    Supervising physician immediately available to respond to emergencies  See telemetry face sheet for immediately available ER MD    Medication changes reported      No    Fall or balance concerns reported     No    Tobacco Cessation  No Change    Warm-up and Cool-down  Performed on first and last piece of equipment    Resistance Training Performed  Yes    VAD Patient?  No      Pain Assessment   Currently in Pain?  No/denies          Social History   Tobacco Use  Smoking Status Never Smoker  Smokeless Tobacco Never Used    Goals Met:  Exercise tolerated well No report of cardiac concerns or symptoms Strength training completed today  Goals Unmet:  Not Applicable  Comments:     Dr. Emily Filbert is Medical Director for [place] and [facility].

## 2017-06-02 ENCOUNTER — Encounter: Payer: Self-pay | Admitting: *Deleted

## 2017-06-02 ENCOUNTER — Encounter: Payer: Medicare HMO | Admitting: *Deleted

## 2017-06-02 DIAGNOSIS — I213 ST elevation (STEMI) myocardial infarction of unspecified site: Secondary | ICD-10-CM | POA: Diagnosis not present

## 2017-06-02 DIAGNOSIS — Z955 Presence of coronary angioplasty implant and graft: Secondary | ICD-10-CM | POA: Diagnosis not present

## 2017-06-02 DIAGNOSIS — Z87442 Personal history of urinary calculi: Secondary | ICD-10-CM | POA: Diagnosis not present

## 2017-06-02 DIAGNOSIS — I10 Essential (primary) hypertension: Secondary | ICD-10-CM | POA: Diagnosis not present

## 2017-06-02 NOTE — Progress Notes (Signed)
Daily Session Note  Patient Details  Name: Allison Sanders MRN: [id number] Date of Birth: 1950/05/12 Referring Provider:     Cardiac Rehab from 05/03/2017 in [facility] Cardiac and Pulmonary Rehab  Referring Provider  Martinique, Peter MD      Encounter Date: 06/02/2017  Check In: Session Check In - 06/02/17 1652      Check-In   Location  [facility]-Cardiac & Pulmonary Rehab    Staff Present  Renita Papa, RN Vickki Hearing, BA, ACSM CEP, Exercise Physiologist;Kavan Devan, RN, BSN Constance Goltz    Supervising physician immediately available to respond to emergencies  See telemetry face sheet for immediately available ER MD    Medication changes reported      No    Fall or balance concerns reported     No    Tobacco Cessation  No Change    Warm-up and Cool-down  Performed on first and last piece of equipment    Resistance Training Performed  Yes    VAD Patient?  No      Pain Assessment   Currently in Pain?  No/denies          Social History   Tobacco Use  Smoking Status Never Smoker  Smokeless Tobacco Never Used    Goals Met:  Proper associated with RPD/PD & O2 Sat Exercise tolerated well Personal goals reviewed Strength training completed today  Goals Unmet:  Not Applicable  Comments:     Dr. Emily Filbert is Medical Director for [place] and [facility].

## 2017-06-03 DIAGNOSIS — Z85528 Personal history of other malignant neoplasm of kidney: Secondary | ICD-10-CM | POA: Diagnosis not present

## 2017-06-03 DIAGNOSIS — Z87442 Personal history of urinary calculi: Secondary | ICD-10-CM | POA: Diagnosis not present

## 2017-06-03 DIAGNOSIS — N200 Calculus of kidney: Secondary | ICD-10-CM | POA: Diagnosis not present

## 2017-06-03 DIAGNOSIS — Z955 Presence of coronary angioplasty implant and graft: Secondary | ICD-10-CM | POA: Diagnosis not present

## 2017-06-03 DIAGNOSIS — I10 Essential (primary) hypertension: Secondary | ICD-10-CM | POA: Diagnosis not present

## 2017-06-03 DIAGNOSIS — I213 ST elevation (STEMI) myocardial infarction of unspecified site: Secondary | ICD-10-CM | POA: Diagnosis not present

## 2017-06-03 DIAGNOSIS — R310 Gross hematuria: Secondary | ICD-10-CM | POA: Diagnosis not present

## 2017-06-03 NOTE — Progress Notes (Signed)
Daily Session Note  Patient Details  Name: Allison Sanders MRN: [id number] Date of Birth: 1950/08/10 Referring Provider:     Cardiac Rehab from 05/03/2017 in [facility] Cardiac and Pulmonary Rehab  Referring Provider  Martinique, Peter MD      Encounter Date: 06/03/2017  Check In: Session Check In - 06/03/17 1628      Check-In   Location  [facility]-Cardiac & Pulmonary Rehab    Staff Present  Justin Mend RCP,RRT,BSRT;Amanda Oletta Darter, BA, ACSM CEP, Exercise Physiologist;Meredith Sherryll Burger, RN BSN    Supervising physician immediately available to respond to emergencies  See telemetry face sheet for immediately available ER MD    Medication changes reported      No    Fall or balance concerns reported     No    Tobacco Cessation  No Change    Warm-up and Cool-down  Performed on first and last piece of equipment    Resistance Training Performed  Yes    VAD Patient?  No      Pain Assessment   Currently in Pain?  No/denies          Social History   Tobacco Use  Smoking Status Never Smoker  Smokeless Tobacco Never Used    Goals Met:  Independence with exercise equipment Exercise tolerated well No report of cardiac concerns or symptoms Strength training completed today  Goals Unmet:  Not Applicable  Comments: Pt able to follow exercise prescription today without complaint.  Will continue to monitor for progression.   Dr. Emily Filbert is Medical Director for [place] and [facility].

## 2017-06-07 ENCOUNTER — Encounter: Payer: Medicare HMO | Admitting: *Deleted

## 2017-06-07 DIAGNOSIS — I10 Essential (primary) hypertension: Secondary | ICD-10-CM | POA: Diagnosis not present

## 2017-06-07 DIAGNOSIS — I2102 ST elevation (STEMI) myocardial infarction involving left anterior descending coronary artery: Secondary | ICD-10-CM | POA: Diagnosis not present

## 2017-06-07 DIAGNOSIS — I2510 Atherosclerotic heart disease of native coronary artery without angina pectoris: Secondary | ICD-10-CM | POA: Diagnosis not present

## 2017-06-07 LAB — LIPID PANEL
CHOL/HDL RATIO: 2 ratio (ref 0.0–4.4)
Cholesterol, Total: 79 mg/dL — ABNORMAL LOW (ref 100–199)
HDL: 39 mg/dL — AB (ref >39)
LDL Calculated: 22 mg/dL (ref 0–99)
Triglycerides: 91 mg/dL (ref 0–149)
VLDL Cholesterol Cal: 18 mg/dL (ref 5–40)

## 2017-06-07 LAB — HEPATIC FUNCTION PANEL
ALBUMIN: 4.4 g/dL (ref 3.6–4.8)
ALK PHOS: 86 IU/L (ref 39–117)
ALT: 30 IU/L (ref 0–32)
AST: 19 IU/L (ref 0–40)
BILIRUBIN, DIRECT: 0.32 mg/dL (ref 0.00–0.40)
Bilirubin Total: 0.9 mg/dL (ref 0.0–1.2)
TOTAL PROTEIN: 6.9 g/dL (ref 6.0–8.5)

## 2017-06-09 ENCOUNTER — Encounter: Payer: Medicare HMO | Admitting: *Deleted

## 2017-06-09 DIAGNOSIS — Z87442 Personal history of urinary calculi: Secondary | ICD-10-CM | POA: Diagnosis not present

## 2017-06-09 DIAGNOSIS — Z955 Presence of coronary angioplasty implant and graft: Secondary | ICD-10-CM

## 2017-06-09 DIAGNOSIS — I10 Essential (primary) hypertension: Secondary | ICD-10-CM | POA: Diagnosis not present

## 2017-06-09 DIAGNOSIS — I213 ST elevation (STEMI) myocardial infarction of unspecified site: Secondary | ICD-10-CM | POA: Diagnosis not present

## 2017-06-09 NOTE — Progress Notes (Signed)
Daily Session Note  Patient Details  Name: Allison Sanders MRN: [id number] Date of Birth: [birth year] Referring Provider:     Cardiac Rehab from 05/03/2017 in [facility] Cardiac and Pulmonary Rehab  Referring Provider  Martinique, Peter MD      Encounter Date: 06/09/2017  Check In: Session Check In - 06/09/17 1640      Check-In   Location  [facility]Cardiac & Pulmonary Rehab    Staff Present  Renita Papa, RN Vickki Hearing, BA, ACSM CEP, Exercise Physiologist;Carroll Enterkin, RN, BSN    Supervising physician immediately available to respond to emergencies  See telemetry face sheet for immediately available ER MD    Medication changes reported      No    Fall or balance concerns reported     No    Warm-up and Cool-down  Performed on first and last piece of equipment    Resistance Training Performed  Yes    VAD Patient?  No      Pain Assessment   Currently in Pain?  No/denies        Exercise Prescription Changes - 06/09/17 1400      Response to Exercise   Blood Pressure (Admit)  120/58    Blood Pressure (Exercise)  144/76    Blood Pressure (Exit)  130/60    Heart Rate (Admit)  81 bpm    Heart Rate (Exercise)  98 bpm    Heart Rate (Exit)  78 bpm    Rating of Perceived Exertion (Exercise)  13    Symptoms  none    Duration  Continue with 45 min of aerobic exercise without signs/symptoms of physical distress.       Social History   Tobacco Use  Smoking Status Never Smoker  Smokeless Tobacco Never Used    Goals Met:  Independence with exercise equipment Exercise tolerated well No report of cardiac concerns or symptoms Strength training completed today  Goals Unmet:  Not Applicable  Comments: Pt able to follow exercise prescription today without complaint.  Will continue to monitor for progression.    Dr. Emily Filbert is Medical Director for [place] and [facility].

## 2017-06-10 ENCOUNTER — Telehealth: Payer: Self-pay

## 2017-06-10 ENCOUNTER — Encounter: Payer: Medicare HMO | Admitting: *Deleted

## 2017-06-10 DIAGNOSIS — Z955 Presence of coronary angioplasty implant and graft: Secondary | ICD-10-CM | POA: Diagnosis not present

## 2017-06-10 DIAGNOSIS — I213 ST elevation (STEMI) myocardial infarction of unspecified site: Secondary | ICD-10-CM

## 2017-06-10 DIAGNOSIS — Z87442 Personal history of urinary calculi: Secondary | ICD-10-CM | POA: Diagnosis not present

## 2017-06-10 DIAGNOSIS — I10 Essential (primary) hypertension: Secondary | ICD-10-CM | POA: Diagnosis not present

## 2017-06-10 NOTE — Telephone Encounter (Signed)
-----   Message from [place], [place] sent at 06/09/2017  4:00 PM EDT ----- Cholesterol quite well controlled except the good cholesterol HDL is borderline low. Liver function normal.

## 2017-06-10 NOTE — Telephone Encounter (Signed)
Gave pateint her lab results and she wanted to know if there was anything she could do to bring up her good cholesterol?

## 2017-06-10 NOTE — Progress Notes (Signed)
This encounter was created in error - please disregard.

## 2017-06-10 NOTE — Telephone Encounter (Signed)
The primary way to raise the good cholesterol is through activity. Increasing the activity will help it.

## 2017-06-10 NOTE — Progress Notes (Signed)
Daily Session Note  Patient Details  Name: Allison Sanders MRN: [id number] Date of Birth: 10/08/1950 Referring Provider:     Cardiac Rehab from 05/03/2017 in [facility] Cardiac and Pulmonary Rehab  Referring Provider  Jordan, Peter MD      Encounter Date: 06/10/2017  Check In: Session Check In - 06/10/17 1648      Check-In   Location  [facility]-Cardiac & Pulmonary Rehab    Staff Present  Kelly Hayes, BS, ACSM CEP, Exercise Physiologist;Laureen Brown, BS, RRT, Respiratory Therapist;Meredith Craven, RN BSN    Supervising physician immediately available to respond to emergencies  See telemetry face sheet for immediately available ER MD    Medication changes reported      No    Fall or balance concerns reported     No    Warm-up and Cool-down  Performed on first and last piece of equipment    Resistance Training Performed  Yes    VAD Patient?  No      Pain Assessment   Currently in Pain?  No/denies    Multiple Pain Sites  No        Exercise Prescription Changes - 06/10/17 1100      Home Exercise Plan   Plans to continue exercise at  Home (comment) walk and do weight training on off days of class    Frequency  Add 2 additional days to program exercise sessions.    Initial Home Exercises Provided  05/27/17       Social History   Tobacco Use  Smoking Status Never Smoker  Smokeless Tobacco Never Used    Goals Met:  Independence with exercise equipment Exercise tolerated well No report of cardiac concerns or symptoms Strength training completed today  Goals Unmet:  Not Applicable  Comments: Pt able to follow exercise prescription today without complaint.  Will continue to monitor for progression.    Dr. Mark Miller is Medical Director for [facility] and [facility]. 

## 2017-06-14 ENCOUNTER — Encounter: Payer: Medicare HMO | Admitting: *Deleted

## 2017-06-14 ENCOUNTER — Encounter: Payer: Self-pay | Admitting: *Deleted

## 2017-06-14 DIAGNOSIS — I213 ST elevation (STEMI) myocardial infarction of unspecified site: Secondary | ICD-10-CM | POA: Diagnosis not present

## 2017-06-14 DIAGNOSIS — I10 Essential (primary) hypertension: Secondary | ICD-10-CM | POA: Diagnosis not present

## 2017-06-14 DIAGNOSIS — Z955 Presence of coronary angioplasty implant and graft: Secondary | ICD-10-CM | POA: Diagnosis not present

## 2017-06-14 DIAGNOSIS — Z87442 Personal history of urinary calculi: Secondary | ICD-10-CM | POA: Diagnosis not present

## 2017-06-14 NOTE — Progress Notes (Signed)
Daily Session Note  Patient Details  Name: Allison Sanders MRN: [id number] Date of Birth: January 21, 1951 Referring Provider:     Cardiac Rehab from 05/03/2017 in [facility] Cardiac and Pulmonary Rehab  Referring Provider  Martinique, Peter MD      Encounter Date: 06/14/2017  Check In: Session Check In - 06/14/17 1712      Check-In   Location  [facility]Cardiac & Pulmonary Rehab    Staff Present  Gerlene Burdock, RN, Moises Blood, BS, ACSM CEP, Exercise Physiologist;Meredith Sherryll Burger, RN BSN;Other    Supervising physician immediately available to respond to emergencies  See telemetry face sheet for immediately available ER MD    Medication changes reported      No    Fall or balance concerns reported     No    Tobacco Cessation  No Change    Warm-up and Cool-down  Performed as group-led instruction    Resistance Training Performed  Yes    VAD Patient?  No      Pain Assessment   Currently in Pain?  No/denies          Social History   Tobacco Use  Smoking Status Never Smoker  Smokeless Tobacco Never Used    Goals Met:  Proper associated with RPD/PD & O2 Sat Exercise tolerated well No report of cardiac concerns or symptoms Strength training completed today  Goals Unmet:  Not Applicable  Comments:     Dr. Emily Filbert is Medical Director for [place] and [facility].

## 2017-06-16 ENCOUNTER — Encounter: Payer: Self-pay | Admitting: *Deleted

## 2017-06-16 DIAGNOSIS — Z87442 Personal history of urinary calculi: Secondary | ICD-10-CM | POA: Diagnosis not present

## 2017-06-16 DIAGNOSIS — Z955 Presence of coronary angioplasty implant and graft: Secondary | ICD-10-CM | POA: Diagnosis not present

## 2017-06-16 DIAGNOSIS — I213 ST elevation (STEMI) myocardial infarction of unspecified site: Secondary | ICD-10-CM | POA: Diagnosis not present

## 2017-06-16 DIAGNOSIS — I10 Essential (primary) hypertension: Secondary | ICD-10-CM | POA: Diagnosis not present

## 2017-06-16 NOTE — Progress Notes (Signed)
Daily Session Note  Patient Details  Name: Allison Sanders MRN: [id number] Date of Birth: March 15, 1950 Referring Provider:     Cardiac Rehab from 05/03/2017 in [facility] Cardiac and Pulmonary Rehab  Referring Provider  Martinique, Peter MD      Encounter Date: 06/16/2017  Check In: Session Check In - 06/16/17 1706      Check-In   Location  [facility]Cardiac [facility] Pulmonary Rehab    Staff Present  Renita Papa, RN Vickki Hearing, BA, ACSM CEP, Exercise Physiologist;Carroll Enterkin, RN, BSN    Supervising physician immediately available to respond to emergencies  See telemetry face sheet for immediately available ER MD    Medication changes reported      No    Fall or balance concerns reported     No    Tobacco Cessation  No Change    Warm-up and Cool-down  Performed on first and last piece of equipment    Resistance Training Performed  Yes    VAD Patient?  No      Pain Assessment   Currently in Pain?  No/denies          Social History   Tobacco Use  Smoking Status Never Smoker  Smokeless Tobacco Never Used    Goals Met:  Independence with exercise equipment Exercise tolerated well No report of cardiac concerns or symptoms Strength training completed today  Goals Unmet:  Not Applicable  Comments: Pt able to follow exercise prescription today without complaint.  Will continue to monitor for progression.    Dr. Emily Filbert is Medical Director for [place] and [facility].

## 2017-06-16 NOTE — Progress Notes (Signed)
Cardiac Individual Treatment Plan  Patient Details  Name: Allison Sanders MRN: [id number] Date of Birth: Jul 15, 1950 Referring Provider:     Cardiac Rehab from 05/03/2017 in [facility] Cardiac and Pulmonary Rehab  Referring Provider  Martinique, Peter MD      Initial Encounter Date:    Cardiac Rehab from 05/03/2017 in [facility] Cardiac and Pulmonary Rehab  Date  05/03/17  Referring Provider  Martinique, Peter MD      Visit Diagnosis: ST elevation myocardial infarction (STEMI), unspecified artery [facility])  Status post coronary artery stent placement  Patient's Home Medications on Admission:  Current Outpatient Medications:  .  aspirin 81 MG chewable tablet, Chew 1 tablet (81 mg total) by mouth daily., Disp: 30 tablet, Rfl: 2 .  atorvastatin (LIPITOR) 80 MG tablet, Take 1 tablet (80 mg total) by mouth daily at 6 PM., Disp: 30 tablet, Rfl: 2 .  cephALEXin (KEFLEX) 500 MG capsule, Take 2 capsules (1,000 mg total) by mouth 2 (two) times daily., Disp: 28 capsule, Rfl: 0 .  cholecalciferol (VITAMIN D) 1000 UNITS tablet, Take 2,000 Units by mouth daily., Disp: , Rfl:  .  irbesartan (AVAPRO) 75 MG tablet, Take 1 tablet (75 mg total) by mouth daily., Disp: 30 tablet, Rfl: 2 .  metoprolol succinate (TOPROL-XL) 25 MG 24 hr tablet, Take 1 tablet (25 mg total) by mouth at bedtime., Disp: 30 tablet, Rfl: 2 .  nitroGLYCERIN (NITROSTAT) 0.4 MG SL tablet, Place 1 tablet (0.4 mg total) under the tongue every 5 (five) minutes x 3 doses as needed for chest pain., Disp: 30 tablet, Rfl: 2 .  spironolactone (ALDACTONE) 25 MG tablet, Take 0.5 tablets (12.5 mg total) by mouth daily., Disp: 30 tablet, Rfl: 2 .  ticagrelor (BRILINTA) 90 MG TABS tablet, Take 1 tablet (90 mg total) by mouth 2 (two) times daily., Disp: 60 tablet, Rfl: 11 .  vitamin C (ASCORBIC ACID) 250 MG tablet, Take 250 mg by mouth daily., Disp: , Rfl:   Past Medical History: Past Medical History:  Diagnosis Date  . HTN (hypertension)   . Kidney stone      Tobacco Use: Social History   Tobacco Use  Smoking Status Never Smoker  Smokeless Tobacco Never Used    Labs: Recent Review Flowsheet Data    Labs for ITP Cardiac and Pulmonary Rehab Latest Ref Rng & Units 04/05/2017 04/05/2017 04/05/2017 06/07/2017   Cholestrol 100 - 199 mg/dL 175 - 167 79(L)   LDLCALC 0 - 99 mg/dL 95 - 91 22   HDL >39 mg/dL 53 - 51 39(L)   Trlycerides 0 - 149 mg/dL 137 - 123 91   Hemoglobin A1c 4.8 - 5.6 % - - 5.7(H) -   TCO2 22 - 32 mmol/L - 24 - -       Exercise Target Goals:    Exercise Program Goal: Individual exercise prescription set using results from initial 6 min walk test and THRR while considering  patient's activity barriers and safety.   Exercise Prescription Goal: Initial exercise prescription builds to 30-45 minutes a day of aerobic activity, 2-3 days per week.  Home exercise guidelines will be given to patient during program as part of exercise prescription that the participant will acknowledge.  Activity Barriers & Risk Stratification: Activity Barriers & Cardiac Risk Stratification - 05/03/17 1333      Activity Barriers & Cardiac Risk Stratification   Activity Barriers  Other (comment)    Comments  Has bladder concerns and was instructed by her physician not to  lift. She  voiced that she is aware to not do any exercise that she feels may hurt her bladder condition.        6 Minute Walk: 6 Minute Walk    Row Name 05/03/17 1519         6 Minute Walk   Phase  Initial     Distance  1115 feet     Walk Time  6 minutes     # of Rest Breaks  0     MPH  2.11     METS  2.79     RPE  10     VO2 Peak  9.76     Symptoms  No     Resting HR  56 bpm     Resting BP  126/82     Resting Oxygen Saturation   100 %     Exercise Oxygen Saturation  during 6 min walk  100 %     Max Ex. HR  107 bpm     Max Ex. BP  150/68     2 Minute Post BP  124/70        Oxygen Initial Assessment:   Oxygen Re-Evaluation:   Oxygen Discharge (Final  Oxygen Re-Evaluation):   Initial Exercise Prescription: Initial Exercise Prescription - 05/03/17 1500      Date of Initial Exercise RX and Referring Provider   Date  05/03/17    Referring Provider  Martinique, Peter MD      Treadmill   MPH  2.1    Grade  0.5    Minutes  15    METs  2.75      NuStep   Level  1    SPM  80    Minutes  15    METs  2.7      Arm Ergometer   Level  1    Watts  38    RPM  25    Minutes  15    METs  2.79      Prescription Details   Frequency (times per week)  3    Duration  Progress to 45 minutes of aerobic exercise without signs/symptoms of physical distress      Intensity   THRR 40-80% of Max Heartrate  95-134    Ratings of Perceived Exertion  11-13    Perceived Dyspnea  0-4      Progression   Progression  Continue to progress workloads to maintain intensity without signs/symptoms of physical distress.      Resistance Training   Training Prescription  Yes    Weight  3    Reps  10-15       Perform Capillary Blood Glucose checks as needed.  Exercise Prescription Changes: Exercise Prescription Changes    Row Name 05/03/17 1500 05/12/17 1200 05/27/17 1600 06/09/17 1400 06/10/17 1000     Response to Exercise   Blood Pressure (Admit)  126/82  126/64  -  120/58  122/62   Blood Pressure (Exercise)  150/68  142/80  -  144/76  140/66   Blood Pressure (Exit)  124/70  112/70  -  130/60  118/64   Heart Rate (Admit)  56 bpm  72 bpm  -  81 bpm  78 bpm   Heart Rate (Exercise)  107 bpm  96 bpm  -  98 bpm  85 bpm   Heart Rate (Exit)  62 bpm  69 bpm  -  78 bpm  73 bpm  Oxygen Saturation (Admit)  100 %  -  -  -  -   Oxygen Saturation (Exercise)  100 %  -  -  -  -   Rating of Perceived Exertion (Exercise)  10  13  -  13  13   Symptoms  none  none  -  none  none   Comments  walk test results  -  -  -  -   Duration  -  Progress to 45 minutes of aerobic exercise without signs/symptoms of physical distress  Progress to 45 minutes of aerobic exercise  without signs/symptoms of physical distress  Continue with 45 min of aerobic exercise without signs/symptoms of physical distress.  Continue with 45 min of aerobic exercise without signs/symptoms of physical distress.   Intensity  -  THRR unchanged  THRR unchanged  -  THRR unchanged     Progression   Progression  -  -  -  -  Continue to progress workloads to maintain intensity without signs/symptoms of physical distress.   Average METs  -  2.6  2.6  -  2.2     Resistance Training   Training Prescription  -  Yes  Yes  -  Yes   Weight  -  3 lb  3 lb  -  3 lb   Reps  -  10-15  10-15  -  10-15     Treadmill   MPH  -  2.1  2.1  -  -   Grade  -  0.5  0.5  -  -   Minutes  -  15  15  -  -   METs  -  2.9  2.9  -  -     NuStep   Level  -  1  1  -  3   SPM  -  80  80  -  80   Minutes  -  15  15  -  15   METs  -  2.4  2.4  -  2.2     Arm Ergometer   Level  -  -  -  -  1.5   Minutes  -  -  -  -  15   METs  -  -  -  -  2.1     Home Exercise Plan   Plans to continue exercise at  -  -  Home (comment) walk and do weight training on off days of class  -  -   Frequency  -  -  Add 2 additional days to program exercise sessions.  -  -   Initial Home Exercises Provided  -  -  05/27/17  -  -   [place] Name 06/10/17 1100             Home Exercise Plan   Plans to continue exercise at  Home (comment) walk and do weight training on off days of class       Frequency  Add 2 additional days to program exercise sessions.       Initial Home Exercises Provided  05/27/17          Exercise Comments: Exercise Comments    Row Name 05/10/17 1716           Exercise Comments  First full day of exercise!  Patient was oriented to gym and equipment including functions, settings, policies, and procedures.  Patient's individual exercise prescription and treatment plan  were reviewed.  All starting workloads were established based on the results of the 6 minute walk test done at initial orientation visit.  The  plan for exercise progression was also introduced and progression will be customized based on patient's performance and goals.          Exercise Goals and Review: Exercise Goals    Row Name 05/03/17 1523             Exercise Goals   Increase Physical Activity  Yes       Intervention  Provide advice, education, support and counseling about physical activity/exercise needs.;Develop an individualized exercise prescription for aerobic and resistive training based on initial evaluation findings, risk stratification, comorbidities and participant's personal goals.       Expected Outcomes  Short Term: Attend rehab on a regular basis to increase amount of physical activity.;Long Term: Add in home exercise to make exercise part of routine and to increase amount of physical activity.;Long Term: Exercising regularly at least 3-5 days a week.       Increase Strength and Stamina  Yes       Intervention  Provide advice, education, support and counseling about physical activity/exercise needs.;Develop an individualized exercise prescription for aerobic and resistive training based on initial evaluation findings, risk stratification, comorbidities and participant's personal goals.       Expected Outcomes  Short Term: Increase workloads from initial exercise prescription for resistance, speed, and METs.;Long Term: Improve cardiorespiratory fitness, muscular endurance and strength as measured by increased METs and functional capacity (6MWT);Short Term: Perform resistance training exercises routinely during rehab and add in resistance training at home       Able to understand and use rate of perceived exertion (RPE) scale  Yes       Intervention  Provide education and explanation on how to use RPE scale       Expected Outcomes  Short Term: Able to use RPE daily in rehab to express subjective intensity level;Long Term:  Able to use RPE to guide intensity level when exercising independently       Knowledge and  understanding of Target Heart Rate Range (THRR)  Yes       Intervention  Provide education and explanation of THRR including how the numbers were predicted and where they are located for reference       Expected Outcomes  Short Term: Able to state/look up THRR;Short Term: Able to use daily as guideline for intensity in rehab;Long Term: Able to use THRR to govern intensity when exercising independently       Able to check pulse independently  Yes       Intervention  Provide education and demonstration on how to check pulse in carotid and radial arteries.;Review the importance of being able to check your own pulse for safety during independent exercise       Expected Outcomes  Short Term: Able to explain why pulse checking is important during independent exercise;Long Term: Able to check pulse independently and accurately       Understanding of Exercise Prescription  Yes       Intervention  Provide education, explanation, and written materials on patient's individual exercise prescription       Expected Outcomes  Short Term: Able to explain program exercise prescription;Long Term: Able to explain home exercise prescription to exercise independently          Exercise Goals Re-Evaluation : Exercise Goals Re-Evaluation    Row Name 05/10/17 1717 05/27/17 1700  06/10/17 1101         Exercise Goal Re-Evaluation   Exercise Goals Review  Increase Physical Activity;Increase Strength and Stamina;Able to understand and use rate of perceived exertion (RPE) scale;Knowledge and understanding of Target Heart Rate Range (THRR)  Increase Physical Activity;Increase Strength and Stamina;Able to understand and use rate of perceived exertion (RPE) scale;Understanding of Exercise Prescription;Knowledge and understanding of Target Heart Rate Range (THRR);Able to check pulse independently  Increase Physical Activity;Increase Strength and Stamina;Able to understand and use rate of perceived exertion (RPE) scale     Comments   Reviewed RPE scale, THR and program prescription with pt today.  Pt voiced understanding and was given a copy of goals to take home.  Home exercise guidelines reviewed with patient. She demonstrated understanding of these guidelines and plans to add 1-2 days of walking outside of class.   Pt is meeting RPE but not HR goals.  Staff will encourage increase in intensity on machines.     Expected Outcomes  Short: Use RPE daily to regulate intensity.  Long: Follow program prescription in THR.  Short: add 1-2 days of exercise outside of class. Long: Become independent with exercise routine.   Short - Pt will increase levels on machines Long - Pt will increase overall MET level        Discharge Exercise Prescription (Final Exercise Prescription Changes): Exercise Prescription Changes - 06/10/17 1100      Home Exercise Plan   Plans to continue exercise at  Home (comment) walk and do weight training on off days of class    Frequency  Add 2 additional days to program exercise sessions.    Initial Home Exercises Provided  05/27/17       Nutrition:  Target Goals: Understanding of nutrition guidelines, daily intake of sodium '1500mg'$ , cholesterol '200mg'$ , calories 30% from fat and 7% or less from saturated fats, daily to have 5 or more servings of fruits and vegetables.  Biometrics: Pre Biometrics - 05/03/17 1524      Pre Biometrics   Height  '5\' 5"'$  (1.651 m)    Weight  179 lb 12.8 oz (81.6 kg)    Waist Circumference  33 inches    Hip Circumference  45 inches    Waist to Hip Ratio  0.73 %    BMI (Calculated)  29.92    Single Leg Stand  25.25 seconds        Nutrition Therapy Plan and Nutrition Goals: Nutrition Therapy & Goals - 05/20/17 1130      Nutrition Therapy   Diet  DASH    Drug/Food Interactions  Statins/Certain Fruits    Protein (specify units)  8oz    Fiber  25 grams    Whole Grain Foods  3 servings    Saturated Fats  13 max. grams    Fruits and Vegetables  6 servings/day 8 ideal     Sodium  1500 grams 1500-2000 recommended by MD per patient      Personal Nutrition Goals   Nutrition Goal  Choose potassium - rich foods daily d/t low potassium. For example, potatoes, avocados and bananas    Personal Goal #2  Practice reading nutrition facts labels to identify sodium and fat content, paying close attention to the types of fats contained in the product IE unsaturated, saturated, trans    Personal Goal #3  Add 1-2 snacks per day, or increase the portions you consume at snacks to combact hunger, as you are well below your daily sodium  limit      Intervention Plan   Intervention  Prescribe, educate and counsel regarding individualized specific dietary modifications aiming towards targeted core components such as weight, hypertension, lipid management, diabetes, heart failure and other comorbidities.;Nutrition handout(s) given to patient. Gave Following a low sodium diet and General guidelines of heart healthy eating handouts    Expected Outcomes  Short Term Goal: Understand basic principles of dietary content, such as calories, fat, sodium, cholesterol and nutrients.;Long Term Goal: Adherence to prescribed nutrition plan.;Short Term Goal: A plan has been developed with personal nutrition goals set during dietitian appointment.       Nutrition Assessments: Nutrition Assessments - 05/03/17 1308      MEDFICTS Scores   Pre Score  3       Nutrition Goals Re-Evaluation: Nutrition Goals Re-Evaluation    [place] Name 05/20/17 1134 05/20/17 1135 06/02/17 1653         Goals   Current Weight  -  -  173 lb (78.5 kg)     Nutrition Goal  Practice reading nutrition facts labels to identify sodium and fat content, paying close attention to the types of fats contained in the product IE unsaturated, saturated, trans  Consume foods rich in potassium d/t having low potassium. For example, avocados, potatoes and bananas  Is trying to eat more bananas and potatoes     Comment  She reports  confusion when trying to interpret the fats section on the label. She has been looking for items low in sodium.  She has started to eat potatoes more frequently since given lab results in February 2019  -     Expected Outcome  Patient will be able to distinguish between the types of dietary fat and will become proficient at knowing what is considered a low sodium item  Patient's potassium will return to WNL  Normal potassium       Personal Goal #2 Re-Evaluation   Personal Goal #2  -  -  Misbah is reading nutrition lables       Personal Goal #3 Re-Evaluation   Personal Goal #3  -  -  controlling hunger        Nutrition Goals Discharge (Final Nutrition Goals Re-Evaluation): Nutrition Goals Re-Evaluation - 06/02/17 1653      Goals   Current Weight  173 lb (78.5 kg)    Nutrition Goal  Is trying to eat more bananas and potatoes    Expected Outcome  Normal potassium      Personal Goal #2 Re-Evaluation   Personal Goal #2  Saia is reading nutrition lables      Personal Goal #3 Re-Evaluation   Personal Goal #3  controlling hunger       Psychosocial: Target Goals: Acknowledge presence or absence of significant depression and/or stress, maximize coping skills, provide positive support system. Participant is able to verbalize types and ability to use techniques and skills needed for reducing stress and depression.   Initial Review & Psychosocial Screening: Initial Psych Review & Screening - 05/03/17 1324      Initial Review   Current issues with  Current Anxiety/Panic;Current Stress Concerns    Source of Stress Concerns  Financial;Family    Comments  Cares for her  32 yo son at home. he is a quadriplegic.  Is in process of a divorce.     Financial concerns with divorce and medical bills.      Family Dynamics   Good Support System?  Yes Family and Friends  Barriers   Psychosocial barriers to participate in program  There are no identifiable barriers or psychosocial needs.;The  patient should benefit from training in stress management and relaxation.      Screening Interventions   Interventions  Encouraged to exercise;Provide feedback about the scores to participant;To provide support and resources with identified psychosocial needs    Expected Outcomes  Short Term goal: Utilizing psychosocial counselor, staff and physician to assist with identification of specific Stressors or current issues interfering with healing process. Setting desired goal for each stressor or current issue identified.;Long Term Goal: Stressors or current issues are controlled or eliminated.;Short Term goal: Identification and review with participant of any Quality of Life or Depression concerns found by scoring the questionnaire.;Long Term goal: The participant improves quality of Life and PHQ9 Scores as seen by post scores and/or verbalization of changes       Quality of Life Scores:  Quality of Life - 05/03/17 1329      Quality of Life Scores   Health/Function Pre  25.5 %    Socioeconomic Pre  22.83 %    Psych/Spiritual Pre  24.21 %    Family Pre  27 %    GLOBAL Pre  24.89 %      Scores of 19 and below usually indicate a poorer quality of life in these areas.  A difference of  2-3 points is a clinically meaningful difference.  A difference of 2-3 points in the total score of the Quality of Life Index has been associated with significant improvement in overall quality of life, self-image, physical symptoms, and general health in studies assessing change in quality of life.  PHQ-9: Recent Review Flowsheet Data    Depression screen [facility] 2/9 05/03/2017   Decreased Interest 0   Down, Depressed, Hopeless 0   PHQ - 2 Score 0   Altered sleeping 0   Change in appetite 0   Feeling bad or failure about yourself  0   Trouble concentrating 0   Moving slowly or fidgety/restless 0   Suicidal thoughts 0   PHQ-9 Score 0   Difficult doing work/chores Not difficult at all     Interpretation of  Total Score  Total Score Depression Severity:  1-4 = Minimal depression, 5-9 = Mild depression, 10-14 = Moderate depression, 15-19 = Moderately severe depression, 20-27 = Severe depression   Psychosocial Evaluation and Intervention: Psychosocial Evaluation - 05/10/17 1657      Psychosocial Evaluation & Interventions   Interventions  Encouraged to exercise with the program and follow exercise prescription;Relaxation education;Stress management education    Comments  Counselor met with [name] Dipierro) today for initial psychsocial evaluation.  She is a 67 year old who had a heart attack on 2/11 with one stent inserted.  Liliahna has a strong support system with family who live close by; a son who lives with her and her local church community.  She sleeps well and has a good appetite - although is "challenged" with what she can eat now since her heart attack.   Kaiana denies a history of depression or anxiety or any current symptoms.  She has a great deal of stress in her life as she is going through a "sticky divorce" the past 2 years; finances are tight; her own health; her son had an accident 46 years ago and is a quadriplegic and Thandiwe's mother and grandmother have both passed away in the past 4 years.  Her goals for this program are "to be  as healthy as possible".  Counselor educated Kadeshia on the benefits of exercise for stress and commended her on her commitment to self-care and her recovery.  Staff will follow her.    Expected Outcomes  Sarye will benefit from consistent exercise to achieve her stated goals.   The educational and psychoeducational components of this program will be beneficial as well to understand and manage more positively.  She will exercise for stress management specifically.    Continue Psychosocial Services   Follow up required by staff       Psychosocial Re-Evaluation: Psychosocial Re-Evaluation    [place] Name 06/02/17 1658             Psychosocial  Re-Evaluation   Current issues with  Current Stress Concerns       Comments  Lucas said she realizes that she was under alot of stress after she moved her 28 year old grandmother down here in a nursing home and Deserai said she went daily to check on her grandmother who died. Jamani also takes care of her 67 year old quadaplegic son by giving him a bath, dressed, taking care of his pressure sore and doing his exercises with him before she gets him up. Her sister is currently coming over every day to do this while Clessie is in Cardiac Rehab. Gredmarie is going to ask her MD if she can go back to taking care of her son some. I explained that Cardiac Rehab is working to strenghten her heart more aerobic.       Expected Outcomes  Genessis said "God is good" and she believes God will get her thru her divorce and all of this. Saragrace said her son is very pleasant and has the best attitude.        Interventions  Encouraged to attend Cardiac Rehabilitation for the exercise       Continue Psychosocial Services   Follow up required by staff          Psychosocial Discharge (Final Psychosocial Re-Evaluation): Psychosocial Re-Evaluation - 06/02/17 1658      Psychosocial Re-Evaluation   Current issues with  Current Stress Concerns    Comments  Ronin said she realizes that she was under alot of stress after she moved her 53 year old grandmother down here in a nursing home and Adrianna said she went daily to check on her grandmother who died. Naavya also takes care of her 67 year old quadaplegic son by giving him a bath, dressed, taking care of his pressure sore and doing his exercises with him before she gets him up. Her sister is currently coming over every day to do this while Erin is in Cardiac Rehab. Zimal is going to ask her MD if she can go back to taking care of her son some. I explained that Cardiac Rehab is working to strenghten her heart more aerobic.    Expected Outcomes  Azura  said "God is good" and she believes God will get her thru her divorce and all of this. Maryelizabeth said her son is very pleasant and has the best attitude.     Interventions  Encouraged to attend Cardiac Rehabilitation for the exercise    Continue Psychosocial Services   Follow up required by staff       Vocational Rehabilitation: Provide vocational rehab assistance to qualifying candidates.   Vocational Rehab Evaluation & Intervention: Vocational Rehab - 05/03/17 1333      Initial Vocational Rehab Evaluation & Intervention   Assessment  shows need for Vocational Rehabilitation  No       Education: Education Goals: Education classes will be provided on a variety of topics geared toward better understanding of heart health and risk factor modification. Participant will state understanding/return demonstration of topics presented as noted by education test scores.  Learning Barriers/Preferences: Learning Barriers/Preferences - 05/03/17 1332      Learning Barriers/Preferences   Learning Barriers  None    Learning Preferences  None       Education Topics:  AED/CPR: - Group verbal and written instruction with the use of models to demonstrate the basic use of the AED with the basic ABC's of resuscitation.   Cardiac Rehab from 06/14/2017 in [facility] Cardiac and Pulmonary Rehab  Date  05/26/17  Educator  C.Firthcliffe  Instruction Review Code  1- IT trainer Nutrition Guidelines/Fats and Fiber: -Group instruction provided by verbal, written material, models and posters to present the general guidelines for heart healthy nutrition. Gives an explanation and review of dietary fats and fiber.   Controlling Sodium/Reading Food Labels: -Group verbal and written material supporting the discussion of sodium use in heart healthy nutrition. Review and explanation with models, verbal and written materials for utilization of the food label.   Cardiac Rehab from 06/14/2017 in  [facility] Cardiac and Pulmonary Rehab  Date  05/24/17  Educator  PI  Instruction Review Code  1- Verbalizes Understanding      Exercise Physiology & General Exercise Guidelines: - Group verbal and written instruction with models to review the exercise physiology of the cardiovascular system and associated critical values. Provides general exercise guidelines with specific guidelines to those with heart or lung disease.    Cardiac Rehab from 06/14/2017 in [facility] Cardiac and Pulmonary Rehab  Date  06/02/17  Educator  AS  Instruction Review Code  1- Verbalizes Understanding      Aerobic Exercise & Resistance Training: - Gives group verbal and written instruction on the various components of exercise. Focuses on aerobic and resistive training programs and the benefits of this training and how to safely progress through these programs..   Flexibility, Balance, Mind/Body Relaxation: Provides group verbal/written instruction on the benefits of flexibility and balance training, including mind/body exercise modes such as yoga, pilates and tai chi.  Demonstration and skill practice provided.   Cardiac Rehab from 06/14/2017 in [facility] Cardiac and Pulmonary Rehab  Date  06/14/17  Educator  K.Amedeo Plenty  Instruction Review Code  1- Verbalizes Understanding      Stress and Anxiety: - Provides group verbal and written instruction about the health risks of elevated stress and causes of high stress.  Discuss the correlation between heart/lung disease and anxiety and treatment options. Review healthy ways to manage with stress and anxiety.   Depression: - Provides group verbal and written instruction on the correlation between heart/lung disease and depressed mood, treatment options, and the stigmas associated with seeking treatment.   Cardiac Rehab from 06/14/2017 in [facility] Cardiac and Pulmonary Rehab  Date  06/09/17  Educator  [facility]  Instruction Review Code  1- Verbalizes Understanding      Anatomy & Physiology  of the Heart: - Group verbal and written instruction and models provide basic cardiac anatomy and physiology, with the coronary electrical and arterial systems. Review of Valvular disease and Heart Failure   Cardiac Rehab from 06/14/2017 in [facility] Cardiac and Pulmonary Rehab  Date  05/10/17  Educator  CE  Instruction Review Code  1- Verbalizes Understanding  Cardiac Procedures: - Group verbal and written instruction to review commonly prescribed medications for heart disease. Reviews the medication, class of the drug, and side effects. Includes the steps to properly store meds and maintain the prescription regimen. (beta blockers and nitrates)   Cardiac Rehab from 06/14/2017 in [facility] Cardiac and Pulmonary Rehab  Date  05/19/17  Educator  [facility]  Instruction Review Code  1- Verbalizes Understanding      Cardiac Medications I: - Group verbal and written instruction to review commonly prescribed medications for heart disease. Reviews the medication, class of the drug, and side effects. Includes the steps to properly store meds and maintain the prescription regimen.   Cardiac Medications II: -Group verbal and written instruction to review commonly prescribed medications for heart disease. Reviews the medication, class of the drug, and side effects. (all other drug classes)    Go Sex-Intimacy & Heart Disease, Get SMART - Goal Setting: - Group verbal and written instruction through game format to discuss heart disease and the return to sexual intimacy. Provides group verbal and written material to discuss and apply goal setting through the application of the S.M.A.R.T. Method.   Cardiac Rehab from 06/14/2017 in [facility] Cardiac and Pulmonary Rehab  Date  05/19/17  Educator  [facility]  Instruction Review Code  1- Verbalizes Understanding      Other Matters of the Heart: - Provides group verbal, written materials and models to describe Stable Angina and Peripheral Artery. Includes description of the  disease process and treatment options available to the cardiac patient.   Exercise & Equipment Safety: - Individual verbal instruction and demonstration of equipment use and safety with use of the equipment.   Cardiac Rehab from 06/14/2017 in [facility] Cardiac and Pulmonary Rehab  Date  05/03/17  Educator  [facility]  Instruction Review Code  1- Verbalizes Understanding      Infection Prevention: - Provides verbal and written material to individual with discussion of infection control including proper hand washing and proper equipment cleaning during exercise session.   Cardiac Rehab from 06/14/2017 in [facility] Cardiac and Pulmonary Rehab  Date  05/03/17  Educator  [facility]  Instruction Review Code  1- Verbalizes Understanding      Falls Prevention: - Provides verbal and written material to individual with discussion of falls prevention and safety.   Cardiac Rehab from 06/14/2017 in [facility] Cardiac and Pulmonary Rehab  Date  05/03/17  Educator  [facility]  Instruction Review Code  1- Verbalizes Understanding      Diabetes: - Individual verbal and written instruction to review signs/symptoms of diabetes, desired ranges of glucose level fasting, after meals and with exercise. Acknowledge that pre and post exercise glucose checks will be done for 3 sessions at entry of program.   Know Your Numbers and Risk Factors: -Group verbal and written instruction about important numbers in your health.  Discussion of what are risk factors and how they play a role in the disease process.  Review of Cholesterol, Blood Pressure, Diabetes, and BMI and the role they play in your overall health.   Sleep Hygiene: -Provides group verbal and written instruction about how sleep can affect your health.  Define sleep hygiene, discuss sleep cycles and impact of sleep habits. Review good sleep hygiene tips.    Cardiac Rehab from 06/14/2017 in [facility] Cardiac and Pulmonary Rehab  Date  05/12/17  Educator  Lucianne Lei, MSW  Instruction  Review Code  2- Demonstrated Understanding      Other: -Provides group and verbal  instruction on various topics (see comments)   Knowledge Questionnaire Score: Knowledge Questionnaire Score - 05/03/17 1332      Knowledge Questionnaire Score   Pre Score  25/28 Reviewed correct responses with Mardene Celeste. She verbalized understanding of the correct responses and had no further questions today       Core Components/Risk Factors/Patient Goals at Admission: Personal Goals and Risk Factors at Admission - 05/03/17 1308      Core Components/Risk Factors/Patient Goals on Admission    Weight Management  Obesity;Yes    Intervention  Weight Management: Develop a combined nutrition and exercise program designed to reach desired caloric intake, while maintaining appropriate intake of nutrient and fiber, sodium and fats, and appropriate energy expenditure required for the weight goal.;Weight Management: Provide education and appropriate resources to help participant work on and attain dietary goals.;Weight Management/Obesity: Establish reasonable short term and long term weight goals.;Obesity: Provide education and appropriate resources to help participant work on and attain dietary goals.    Admit Weight  179 lb 12.8 oz (81.6 kg)    Goal Weight: Short Term  177 lb (80.3 kg)    Goal Weight: Long Term  155 lb (70.3 kg)    Expected Outcomes  Short Term: Continue to assess and modify interventions until short term weight is achieved;Weight Loss: Understanding of general recommendations for a balanced deficit meal plan, which promotes 1-2 lb weight loss per week and includes a negative energy balance of [phone] kcal/d    Hypertension  Yes    Intervention  Provide education on lifestyle modifcations including regular physical activity/exercise, weight management, moderate sodium restriction and increased consumption of fresh fruit, vegetables, and low fat dairy, alcohol moderation, and smoking  cessation.;Monitor prescription use compliance.    Expected Outcomes  Short Term: Continued assessment and intervention until BP is < 140/40m HG in hypertensive participants. < 130/880mHG in hypertensive participants with diabetes, heart failure or chronic kidney disease.;Long Term: Maintenance of blood pressure at goal levels.    Lipids  Yes    Intervention  Provide education and support for participant on nutrition & aerobic/resistive exercise along with prescribed medications to achieve LDL '70mg'$ , HDL >'40mg'$ .    Expected Outcomes  Short Term: Participant states understanding of desired cholesterol values and is compliant with medications prescribed. Participant is following exercise prescription and nutrition guidelines.;Long Term: Cholesterol controlled with medications as prescribed, with individualized exercise RX and with personalized nutrition plan. Value goals: LDL < '70mg'$ , HDL > 40 mg.    Stress  Yes Cares for 3344o son at home. He is quadriplegic    Intervention  Offer individual and/or small group education and counseling on adjustment to heart disease, stress management and health-related lifestyle change. Teach and support self-help strategies.;Refer participants experiencing significant psychosocial distress to appropriate mental health specialists for further evaluation and treatment. When possible, include family members and significant others in education/counseling sessions.    Expected Outcomes  Short Term: Participant demonstrates changes in health-related behavior, relaxation and other stress management skills, ability to obtain effective social support, and compliance with psychotropic medications if prescribed.;Long Term: Emotional wellbeing is indicated by absence of clinically significant psychosocial distress or social isolation.       Core Components/Risk Factors/Patient Goals Review:  Goals and Risk Factor Review    Row Name 06/02/17 1655             Core  Components/Risk Factors/Patient Goals Review   Personal Goals Review  Weight Management/Obesity;Hypertension;Stress;Lipids;Other  Review  Wt today is 173lbs blood pressure is 114/64. Adiva is going to ask her MD if she can start to go back to taking care of her 67 year old quadaplegic son who had  a car accident 33 years ago. She does his exercises with him, baths, him, takes care of his pressure sore, gets him dressed and up in a chair daily. He does drive she said.        Expected Outcomes  Cont heart healthy living by having good lipid panel numbers and decrease stress.          Core Components/Risk Factors/Patient Goals at Discharge (Final Review):  Goals and Risk Factor Review - 06/02/17 1655      Core Components/Risk Factors/Patient Goals Review   Personal Goals Review  Weight Management/Obesity;Hypertension;Stress;Lipids;Other    Review  Wt today is 173lbs blood pressure is 114/64. Rasheida is going to ask her MD if she can start to go back to taking care of her 67 year old quadaplegic son who had  a car accident 33 years ago. She does his exercises with him, baths, him, takes care of his pressure sore, gets him dressed and up in a chair daily. He does drive she said.     Expected Outcomes  Cont heart healthy living by having good lipid panel numbers and decrease stress.       ITP Comments: ITP Comments    Row Name 05/03/17 1302 05/19/17 0649 06/16/17 0626       ITP Comments  Medical review completed today. ITP sent to Dr. Sabra Heck to review, change as needed and to sign. Documentation of diagnosis can be foundin [facility] 04/05/2017 04/22/2017  30 Day review. Continue with ITP unless directed changes per Medical Director review.   New to program  30 day review. Continue with ITP unless directed changes per Medical Director        Comments:

## 2017-06-17 DIAGNOSIS — Z87442 Personal history of urinary calculi: Secondary | ICD-10-CM | POA: Diagnosis not present

## 2017-06-17 DIAGNOSIS — I10 Essential (primary) hypertension: Secondary | ICD-10-CM | POA: Diagnosis not present

## 2017-06-17 DIAGNOSIS — I213 ST elevation (STEMI) myocardial infarction of unspecified site: Secondary | ICD-10-CM

## 2017-06-17 DIAGNOSIS — Z955 Presence of coronary angioplasty implant and graft: Secondary | ICD-10-CM | POA: Diagnosis not present

## 2017-06-17 NOTE — Progress Notes (Signed)
Daily Session Note  Patient Details  Name: Allison Sanders MRN: [id number] Date of Birth: 08-14-1950 Referring Provider:     Cardiac Rehab from 05/03/2017 in [facility] Cardiac and Pulmonary Rehab  Referring Provider  Martinique, Peter MD      Encounter Date: 06/17/2017  Check In: Session Check In - 06/17/17 1618      Check-In   Location  [facility]-Cardiac & Pulmonary Rehab    Staff Present  Justin Mend Jaci Carrel, BS, ACSM CEP, Exercise Physiologist;Meredith Sherryll Burger, RN BSN    Supervising physician immediately available to respond to emergencies  See telemetry face sheet for immediately available ER MD    Medication changes reported      No    Fall or balance concerns reported     No    Tobacco Cessation  No Change    Warm-up and Cool-down  Performed on first and last piece of equipment    Resistance Training Performed  Yes    VAD Patient?  No      Pain Assessment   Currently in Pain?  No/denies          Social History   Tobacco Use  Smoking Status Never Smoker  Smokeless Tobacco Never Used    Goals Met:  Independence with exercise equipment Exercise tolerated well No report of cardiac concerns or symptoms Strength training completed today  Goals Unmet:  Not Applicable  Comments: Pt able to follow exercise prescription today without complaint.  Will continue to monitor for progression.   Dr. Emily Filbert is Medical Director for [facility].

## 2017-06-21 ENCOUNTER — Encounter: Payer: Medicare HMO | Admitting: *Deleted

## 2017-06-21 DIAGNOSIS — Z955 Presence of coronary angioplasty implant and graft: Secondary | ICD-10-CM

## 2017-06-21 DIAGNOSIS — I10 Essential (primary) hypertension: Secondary | ICD-10-CM | POA: Diagnosis not present

## 2017-06-21 DIAGNOSIS — I213 ST elevation (STEMI) myocardial infarction of unspecified site: Secondary | ICD-10-CM | POA: Diagnosis not present

## 2017-06-21 DIAGNOSIS — Z87442 Personal history of urinary calculi: Secondary | ICD-10-CM | POA: Diagnosis not present

## 2017-06-21 NOTE — Progress Notes (Signed)
Daily Session Note  Patient Details  Name: Allison Sanders MRN: [id number] Date of Birth: 10/20/1950 Referring Provider:     Cardiac Rehab from 05/03/2017 in [facility] Cardiac and Pulmonary Rehab  Referring Provider  Martinique, Peter MD      Encounter Date: 06/21/2017  Check In: Session Check In - 06/21/17 1709      Check-In   Staff Present  Renita Papa, RN Moises Blood, BS, ACSM CEP, Exercise Physiologist;Iran Kievit, RN, BSN, CCRP;Carroll Enterkin, RN, BSN    Supervising physician immediately available to respond to emergencies  See telemetry face sheet for immediately available ER MD    Medication changes reported      No    Fall or balance concerns reported     No    Warm-up and Cool-down  Performed as group-led instruction    Resistance Training Performed  Yes    VAD Patient?  No      Pain Assessment   Currently in Pain?  No/denies          Social History   Tobacco Use  Smoking Status Never Smoker  Smokeless Tobacco Never Used    Goals Met:  Independence with exercise equipment Exercise tolerated well No report of cardiac concerns or symptoms  Goals Unmet:  Not Applicable  Comments: Doing well with exercise prescription progression.    Dr. Emily Filbert is Medical Director for [place] [facility].

## 2017-06-21 NOTE — Progress Notes (Signed)
Cardiology Office Note    Date:  06/23/2017   ID:  Allison Sanders, DOB 1950/12/25, MRN [id number]  PCP:  Seward Carol, MD  Cardiologist:  Dr. Martinique  Chief Complaint  Patient presents with  . Coronary Artery Disease    History of Present Illness:  Allison Sanders is a 67 y.o. female with PMH of HTN who recently presented to the hospital with anterior STEMI.  Cardiac catheterization performed on 04/05/2017 showed 100% proximal LAD lesion treated with Synergy 3.0 x 20 mm DES, 100% ostial diagonal occlusion, 25% proximal to mid RCA, 30% proximal left circumflex lesion, EF was 50-55% by LV gram.  Follow-up echocardiogram obtained on 04/06/2017 showed EF 35-40%, akinesis of the mid apical anteroseptal, and apical myocardium, grade 2 DD.  Initial lipid panel showed well-controlled cholesterol except for LDL of 95.  Potassium was 2.9 on arrival.  Hemoglobin A1c was 5.7.  Her serial troponin eventually trended up to 4.75.  Post-cath, patient was placed on aspirin, Lipitor, metoprolol and Brilinta.  On follow up today she is doing very well. Participating in Cardiac Rehab and going to the gym. She has changed her eating habits and has lost 15 lbs. 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 She did develop hematuria. CT showed renal calculi. She is planning on having a cystoscopy. She still notes some dizziness when bending over. No chest pain, palpitations, or dyspnea.   Past Medical History:  Diagnosis Date  . HTN (hypertension)   . Kidney stone     Past Surgical History:  Procedure Laterality Date  . CORONARY STENT INTERVENTION N/A 04/05/2017   Procedure: CORONARY STENT INTERVENTION;  Surgeon: Martinique, Treavon Castilleja M, MD;  Location: [facility];  Service: Cardiovascular;  Laterality: N/A;  . CORONARY/GRAFT ACUTE MI REVASCULARIZATION N/A 04/05/2017   Procedure: Coronary/Graft Acute MI Revascularization;  Surgeon: Martinique, Kymoni Monday M, MD;  Location: [facility];  Service:  Cardiovascular;  Laterality: N/A;  . DILATION AND CURETTAGE, DIAGNOSTIC / THERAPEUTIC    . LEFT HEART CATH AND CORONARY ANGIOGRAPHY N/A 04/05/2017   Procedure: LEFT HEART CATH AND CORONARY ANGIOGRAPHY;  Surgeon: Martinique, Maisha Bogen M, MD;  Location: [facility];  Service: Cardiovascular;  Laterality: N/A;  . left nephrectomy      Current Medications: Outpatient Medications Prior to Visit  Medication Sig Dispense Refill  . aspirin 81 MG chewable tablet Chew 1 tablet (81 mg total) by mouth daily. 30 tablet 2  . atorvastatin (LIPITOR) 80 MG tablet Take 1 tablet (80 mg total) by mouth daily at 6 PM. 30 tablet 2  . cephALEXin (KEFLEX) 500 MG capsule Take 2 capsules (1,000 mg total) by mouth 2 (two) times daily. 28 capsule 0  . cholecalciferol (VITAMIN D) 1000 UNITS tablet Take 2,000 Units by mouth daily.    . irbesartan (AVAPRO) 75 MG tablet Take 1 tablet (75 mg total) by mouth daily. 30 tablet 2  . metoprolol succinate (TOPROL-XL) 25 MG 24 hr tablet Take 1 tablet (25 mg total) by mouth at bedtime. 30 tablet 2  . nitroGLYCERIN (NITROSTAT) 0.4 MG SL tablet Place 1 tablet (0.4 mg total) under the tongue every 5 (five) minutes x 3 doses as needed for chest pain. 30 tablet 2  . spironolactone (ALDACTONE) 25 MG tablet Take 0.5 tablets (12.5 mg total) by mouth daily. 30 tablet 2  . ticagrelor (BRILINTA) 90 MG TABS tablet Take 1 tablet (90 mg total) by mouth 2 (two) times daily. 60 tablet 11  .  vitamin C (ASCORBIC ACID) 250 MG tablet Take 250 mg by mouth daily.     No facility-administered medications prior to visit.      Allergies:   Penicillins; Sulfa antibiotics; and Contrast media [iodinated diagnostic agents]   Social History   Socioeconomic History  . Marital status: Single    Spouse name: Not on file  . Number of children: 2  . Years of education: Not on file  . Highest education level: Not on file  Occupational History  . Not on file  Social Needs  . Financial resource strain: Not on  file  . Food insecurity:    Worry: Not on file    Inability: Not on file  . Transportation needs:    Medical: Not on file    Non-medical: Not on file  Tobacco Use  . Smoking status: Never Smoker  . Smokeless tobacco: Never Used  Substance and Sexual Activity  . Alcohol use: No    Alcohol/week: 0.0 oz    Frequency: Never  . Drug use: No  . Sexual activity: Not on file  Lifestyle  . Physical activity:    Days per week: Not on file    Minutes per session: Not on file  . Stress: Not on file  Relationships  . Social connections:    Talks on phone: Not on file    Gets together: Not on file    Attends religious service: Not on file    Active member of club or organization: Not on file    Attends meetings of clubs or organizations: Not on file    Relationship status: Not on file  Other Topics Concern  . Not on file  Social History Narrative  . Not on file     Family History:  The patient's family history includes Heart attack in her father.   ROS:   Please see the history of present illness.    ROS All other systems reviewed and are negative.   PHYSICAL EXAM:   VS:  BP 130/64   Pulse 78   Ht 5\' 5"  (1.651 m)   Wt 168 lb 3.2 oz (76.3 kg)   BMI 27.99 kg/m    GEN: Well nourished, well developed, in no acute distress  HEENT: normal  Neck: no JVD, carotid bruits, or masses Cardiac: RRR; no murmurs, rubs, or gallops,no edema  Respiratory:  clear to auscultation bilaterally, normal work of breathing GI: soft, nontender, nondistended, + BS MS: no deformity or atrophy  Skin: warm and dry, no rash Neuro:  Alert and Oriented x 3, Strength and sensation are intact Psych: euthymic mood, full affect  Wt Readings from Last 3 Encounters:  06/23/17 168 lb 3.2 oz (76.3 kg)  05/03/17 179 lb 12.8 oz (81.6 kg)  04/22/17 181 lb (82.1 kg)      Studies/Labs Reviewed:   EKG:  EKG is not ordered today.   Recent Labs: 04/07/2017: Magnesium 2.0 05/16/2017: BUN 12; Creatinine, Ser  0.98; Hemoglobin 14.0; Platelets 189; Potassium 4.5; Sodium 139 06/07/2017: ALT 30   Lipid Panel    Component Value Date/Time   CHOL 79 (L) 06/07/2017 0908   TRIG 91 06/07/2017 0908   HDL 39 (L) 06/07/2017 0908   CHOLHDL 2.0 06/07/2017 0908   CHOLHDL 3.3 04/05/2017 2044   VLDL 25 04/05/2017 2044   LDLCALC 22 06/07/2017 0908    Additional studies/ records that were reviewed today include:   Cath 04/05/2017 Conclusion     Prox LAD lesion is 100%  stenosed.  A drug-eluting stent was successfully placed using a STENT SYNERGY DES 3X20.  Post intervention, there is a 0% residual stenosis.  Ost 1st Diag lesion is 100% stenosed.  Prox RCA to Mid RCA lesion is 25% stenosed.  Prox Cx lesion is 30% stenosed.  The left ventricular systolic function is normal.  LV end diastolic pressure is normal.  The left ventricular ejection fraction is 50-55% by visual estimate.   1. Single vessel occlusive CAD 2. Mild LV dysfunction with apical wall motion abnormality. 3. Normal LVEDP 4. Successful PCI and stenting of the mid LAD with DES  Plan: DAPT for one year. Risk factor modification.     Echo 04/06/2017 LV EF: 35% -   40%  ------------------------------------------------------------------- Indications:      CAD of native vessels 414.01.  ------------------------------------------------------------------- History:   Risk factors:  Hypertension.  ------------------------------------------------------------------- Study Conclusions  - Left ventricle: The cavity size was normal. Wall thickness was   normal. Systolic function was moderately reduced. The estimated   ejection fraction was in the range of 35% to 40%. Akinesis of the   mid-apicalanteroseptal and apical myocardium. Features are   consistent with a pseudonormal left ventricular filling pattern,   with concomitant abnormal relaxation and increased filling   pressure (grade 2 diastolic dysfunction).  ASSESSMENT:     1. Coronary artery disease involving native coronary artery of native heart without angina pectoris   2. Essential hypertension   3. Ischemic cardiomyopathy      PLAN:  In order of problems listed above:  1. CAD: s/p  anterior STEMI with emergent DES of LAD.  Continue aspirin, Brilinta, Lipitor, beta-blocker.  Continue DAPT for one year.  Will need aggressive risk factor control.   2. Ischemic cardiomyopathy: EF 35-40%.  On Toprol-XL, spironolactone and irbesartan.  She does have some dizziness. We will not titrate drugs further. Will repeat Echo and if EF has improved significantly we may stop aldactone.   3. Hypertension: Blood pressure is well controlled.   4.   HLD excellent control with statin.   Medication Adjustments/Labs and Tests Ordered: Current medicines are reviewed at length with the patient today.  Concerns regarding medicines are outlined above.  Medication changes, Labs and Tests ordered today are listed in the Patient Instructions below. Patient Instructions  Continue your current therapy  We will check an Echocardiogram  You do not have any cardiac restrictions now.      Signed, Shelbe Haglund Martinique, MD  06/23/2017 2:58 PM    [facility] [address], [place], [place]  [address] Phone: [phone]; Fax: [phone]

## 2017-06-21 NOTE — Telephone Encounter (Signed)
Left detailed message on patients cell phone informing of Hao's recommendations. Advised her to contact office for additional questions or concerns.

## 2017-06-23 ENCOUNTER — Encounter: Payer: Medicare HMO | Attending: Cardiology

## 2017-06-23 ENCOUNTER — Encounter: Payer: Self-pay | Admitting: Cardiology

## 2017-06-23 ENCOUNTER — Telehealth: Payer: Self-pay

## 2017-06-23 ENCOUNTER — Ambulatory Visit: Payer: Medicare HMO | Admitting: Cardiology

## 2017-06-23 VITALS — BP 130/64 | HR 78 | Ht 65.0 in | Wt 168.2 lb

## 2017-06-23 DIAGNOSIS — I2510 Atherosclerotic heart disease of native coronary artery without angina pectoris: Secondary | ICD-10-CM

## 2017-06-23 DIAGNOSIS — Z87442 Personal history of urinary calculi: Secondary | ICD-10-CM | POA: Insufficient documentation

## 2017-06-23 DIAGNOSIS — I255 Ischemic cardiomyopathy: Secondary | ICD-10-CM

## 2017-06-23 DIAGNOSIS — I10 Essential (primary) hypertension: Secondary | ICD-10-CM

## 2017-06-23 DIAGNOSIS — Z955 Presence of coronary angioplasty implant and graft: Secondary | ICD-10-CM | POA: Insufficient documentation

## 2017-06-23 DIAGNOSIS — I213 ST elevation (STEMI) myocardial infarction of unspecified site: Secondary | ICD-10-CM | POA: Insufficient documentation

## 2017-06-23 NOTE — Patient Instructions (Signed)
Continue your current therapy  We will check an Echocardiogram  You do not have any cardiac restrictions now.

## 2017-06-23 NOTE — Telephone Encounter (Signed)
Allison Sanders has a Dr appointment and cannot make class today - will be here tomorrow

## 2017-06-24 VITALS — Ht 65.0 in | Wt 169.6 lb

## 2017-06-24 DIAGNOSIS — I10 Essential (primary) hypertension: Secondary | ICD-10-CM | POA: Diagnosis not present

## 2017-06-24 DIAGNOSIS — I213 ST elevation (STEMI) myocardial infarction of unspecified site: Secondary | ICD-10-CM

## 2017-06-24 DIAGNOSIS — Z955 Presence of coronary angioplasty implant and graft: Secondary | ICD-10-CM | POA: Diagnosis not present

## 2017-06-24 DIAGNOSIS — Z87442 Personal history of urinary calculi: Secondary | ICD-10-CM | POA: Diagnosis not present

## 2017-06-24 NOTE — Progress Notes (Signed)
Daily Session Note  Patient Details  Name: Allison Sanders MRN: [id number] Date of Birth: 1950-04-07 Referring Provider:     Cardiac Rehab from 05/03/2017 in [facility]  Referring Provider  Martinique, Peter MD      Encounter Date: 06/24/2017  Check In: Session Check In - 06/24/17 1623      Check-In   Location  [facility] & Pul[facility]    Staff Present  Justin Mend Jaci Carrel, BS, ACSM CEP, Exercise Physiologist;Meredith Sherryll Burger, RN BSN    Supervising physician immediately available to respond to emergencies  See telemetry face sheet for immediately available ER MD    Medication changes reported      No    Fall or balance concerns reported     No    Tobacco Cessation  No Change    Warm-up and Cool-down  Performed on first and last piece of equipment    Resistance Training Performed  Yes    VAD Patient?  No      Pain Assessment   Currently in Pain?  No/denies          Social History   Tobacco Use  Smoking Status Never Smoker  Smokeless Tobacco Never Used    Goals Met:  Independence with exercise equipment Exercise tolerated well No report of cardiac concerns or symptoms Strength training completed today  Goals Unmet:  Not Applicable  Comments: Pt able to follow exercise prescription today without complaint.  Will continue to monitor for progression.   Dr. Emily Filbert is Medical Director for [place] and [facility].

## 2017-06-28 DIAGNOSIS — I10 Essential (primary) hypertension: Secondary | ICD-10-CM | POA: Diagnosis not present

## 2017-06-28 DIAGNOSIS — Z955 Presence of coronary angioplasty implant and graft: Secondary | ICD-10-CM | POA: Diagnosis not present

## 2017-06-28 DIAGNOSIS — I213 ST elevation (STEMI) myocardial infarction of unspecified site: Secondary | ICD-10-CM

## 2017-06-28 DIAGNOSIS — Z87442 Personal history of urinary calculi: Secondary | ICD-10-CM | POA: Diagnosis not present

## 2017-06-28 NOTE — Progress Notes (Signed)
Daily Session Note  Patient Details  Name: Allison Sanders MRN: [id number] Date of Birth: 10-16-1950 Referring Provider:     Cardiac Rehab from 05/03/2017 in [facility]  Referring Provider  Martinique, Peter MD      Encounter Date: 06/28/2017  Check In: Session Check In - 06/28/17 1706      Check-In   Location  [facility]    Staff Present  Earlean Shawl, BS, ACSM CEP, Exercise Physiologist;Amanda Oletta Darter, BA, ACSM CEP, Exercise Physiologist;Carroll Enterkin, RN, BSN    Supervising physician immediately available to respond to emergencies  See telemetry face sheet for immediately available ER MD    Medication changes reported      No    Fall or balance concerns reported     No    Warm-up and Cool-down  Performed on first and last piece of equipment    Resistance Training Performed  Yes    VAD Patient?  No      Pain Assessment   Currently in Pain?  No/denies    Multiple Pain Sites  No          Social History   Tobacco Use  Smoking Status Never Smoker  Smokeless Tobacco Never Used    Goals Met:  Independence with exercise equipment Exercise tolerated well No report of cardiac concerns or symptoms Strength training completed today  Goals Unmet:  Not Applicable  Comments: Pt able to follow exercise prescription today without complaint.  Will continue to monitor for progression.    Dr. Emily Filbert is Medical Director for [place] and [facility].

## 2017-06-29 ENCOUNTER — Other Ambulatory Visit: Payer: Self-pay | Admitting: Internal Medicine

## 2017-06-30 DIAGNOSIS — I10 Essential (primary) hypertension: Secondary | ICD-10-CM | POA: Diagnosis not present

## 2017-06-30 DIAGNOSIS — Z87442 Personal history of urinary calculi: Secondary | ICD-10-CM | POA: Diagnosis not present

## 2017-06-30 DIAGNOSIS — I213 ST elevation (STEMI) myocardial infarction of unspecified site: Secondary | ICD-10-CM

## 2017-06-30 DIAGNOSIS — Z955 Presence of coronary angioplasty implant and graft: Secondary | ICD-10-CM | POA: Diagnosis not present

## 2017-06-30 NOTE — Progress Notes (Signed)
Daily Session Note  Patient Details  Name: Allison Sanders MRN: [id number] Date of Birth: 10/22/1950 Referring Provider:     Cardiac Rehab from 05/03/2017 in [facility]  Referring Provider  Martinique, Peter MD      Encounter Date: 06/30/2017  Check In: Session Check In - 06/30/17 1725      Check-In   Location  [facility]    Staff Present  Renita Papa, RN Vickki Hearing, BA, ACSM CEP, Exercise Physiologist;Carroll Enterkin, RN, BSN    Supervising physician immediately available to respond to emergencies  See telemetry face sheet for immediately available ER MD    Medication changes reported      No    Fall or balance concerns reported     No    Warm-up and Cool-down  Performed on first and last piece of equipment    Resistance Training Performed  Yes    VAD Patient?  No      Pain Assessment   Currently in Pain?  No/denies    Multiple Pain Sites  No          Social History   Tobacco Use  Smoking Status Never Smoker  Smokeless Tobacco Never Used    Goals Met:  Independence with exercise equipment Exercise tolerated well No report of cardiac concerns or symptoms Strength training completed today  Goals Unmet:  Not Applicable  Comments: Pt able to follow exercise prescription today without complaint.  Will continue to monitor for progression.    Dr. Emily Filbert is Medical Director for [facility] and [facility].

## 2017-07-01 ENCOUNTER — Other Ambulatory Visit: Payer: Self-pay | Admitting: Internal Medicine

## 2017-07-01 ENCOUNTER — Other Ambulatory Visit: Payer: Self-pay | Admitting: Cardiology

## 2017-07-01 DIAGNOSIS — I213 ST elevation (STEMI) myocardial infarction of unspecified site: Secondary | ICD-10-CM

## 2017-07-01 DIAGNOSIS — Z955 Presence of coronary angioplasty implant and graft: Secondary | ICD-10-CM | POA: Diagnosis not present

## 2017-07-01 DIAGNOSIS — Z87442 Personal history of urinary calculi: Secondary | ICD-10-CM | POA: Diagnosis not present

## 2017-07-01 DIAGNOSIS — I10 Essential (primary) hypertension: Secondary | ICD-10-CM | POA: Diagnosis not present

## 2017-07-01 MED ORDER — ATORVASTATIN CALCIUM 80 MG PO TABS: 80.0000 mg | ORAL_TABLET | Freq: Every day | ORAL | 11 refills | Status: DC

## 2017-07-01 MED ORDER — METOPROLOL SUCCINATE ER 25 MG PO TB24: 25.0000 mg | ORAL_TABLET | Freq: Every day | ORAL | 11 refills | Status: DC

## 2017-07-01 NOTE — Patient Instructions (Signed)
Discharge Patient Instructions  Patient Details  Name: Allison Sanders MRN: [id number] Date of Birth: 1951/01/13 Referring Provider:  Martinique, Peter M, MD   Number of Visits: 36  Reason for Discharge:  Patient reached a stable level of exercise. Patient independent in their exercise. Patient has met program and personal goals.  Smoking History:  Social History   Tobacco Use  Smoking Status Never Smoker  Smokeless Tobacco Never Used    Diagnosis:  ST elevation myocardial infarction (STEMI), unspecified artery (HCC)  Status post coronary artery stent placement  Initial Exercise Prescription: Initial Exercise Prescription - 05/03/17 1500      Date of Initial Exercise RX and Referring Provider   Date  05/03/17    Referring Provider  Martinique, Peter MD      Treadmill   MPH  2.1    Grade  0.5    Minutes  15    METs  2.75      NuStep   Level  1    SPM  80    Minutes  15    METs  2.7      Arm Ergometer   Level  1    Watts  38    RPM  25    Minutes  15    METs  2.79      Prescription Details   Frequency (times per week)  3    Duration  Progress to 45 minutes of aerobic exercise without signs/symptoms of physical distress      Intensity   THRR 40-80% of Max Heartrate  95-134    Ratings of Perceived Exertion  11-13    Perceived Dyspnea  0-4      Progression   Progression  Continue to progress workloads to maintain intensity without signs/symptoms of physical distress.      Resistance Training   Training Prescription  Yes    Weight  3    Reps  10-15       Discharge Exercise Prescription (Final Exercise Prescription Changes): Exercise Prescription Changes - 06/23/17 1500      Response to Exercise   Blood Pressure (Admit)  130/80    Blood Pressure (Exercise)  134/64    Blood Pressure (Exit)  130/70    Heart Rate (Admit)  60 bpm    Heart Rate (Exercise)  85 bpm    Heart Rate (Exit)  74 bpm    Rating of Perceived Exertion (Exercise)  12    Symptoms   none    Duration  Continue with 45 min of aerobic exercise without signs/symptoms of physical distress.    Intensity  THRR unchanged      Progression   Progression  Continue to progress workloads to maintain intensity without signs/symptoms of physical distress.    Average METs  2.3      NuStep   Level  3    SPM  80    Minutes  15    METs  2.5      Arm Ergometer   Level  1.5    Minutes  15    METs  2.1       Functional Capacity: 6 Minute Walk    Row Name 05/03/17 1519 06/24/17 1636       6 Minute Walk   Phase  Initial  Discharge    Distance  1115 feet  1342 feet    Distance % Change  -  20 %    Distance Feet Change  -  227 ft    Walk Time  6 minutes  6 minutes    # of Rest Breaks  0  0    MPH  2.11  2.54    METS  2.79  3.19    RPE  10  12    Perceived Dyspnea   -  0    VO2 Peak  9.76  11.17    Symptoms  No  -    Resting HR  56 bpm  65 bpm    Resting BP  126/82  134/78    Resting Oxygen Saturation   100 %  -    Exercise Oxygen Saturation  during 6 min walk  100 %  -    Max Ex. HR  107 bpm  105 bpm    Max Ex. BP  150/68  142/64    2 Minute Post BP  124/70  -       Quality of Life: Quality of Life - 05/03/17 1329      Quality of Life Scores   Health/Function Pre  25.5 %    Socioeconomic Pre  22.83 %    Psych/Spiritual Pre  24.21 %    Family Pre  27 %    GLOBAL Pre  24.89 %       Personal Goals: Goals established at orientation with interventions provided to work toward goal. Personal Goals and Risk Factors at Admission - 05/03/17 1308      Core Components/Risk Factors/Patient Goals on Admission    Weight Management  Obesity;Yes    Intervention  Weight Management: Develop a combined nutrition and exercise program designed to reach desired caloric intake, while maintaining appropriate intake of nutrient and fiber, sodium and fats, and appropriate energy expenditure required for the weight goal.;Weight Management: Provide education and appropriate  resources to help participant work on and attain dietary goals.;Weight Management/Obesity: Establish reasonable short term and long term weight goals.;Obesity: Provide education and appropriate resources to help participant work on and attain dietary goals.    Admit Weight  179 lb 12.8 oz (81.6 kg)    Goal Weight: Short Term  177 lb (80.3 kg)    Goal Weight: Long Term  155 lb (70.3 kg)    Expected Outcomes  Short Term: Continue to assess and modify interventions until short term weight is achieved;Weight Loss: Understanding of general recommendations for a balanced deficit meal plan, which promotes 1-2 lb weight loss per week and includes a negative energy balance of [phone] kcal/d    Hypertension  Yes    Intervention  Provide education on lifestyle modifcations including regular physical activity/exercise, weight management, moderate sodium restriction and increased consumption of fresh fruit, vegetables, and low fat dairy, alcohol moderation, and smoking cessation.;Monitor prescription use compliance.    Expected Outcomes  Short Term: Continued assessment and intervention until BP is < 140/61m HG in hypertensive participants. < 130/856mHG in hypertensive participants with diabetes, heart failure or chronic kidney disease.;Long Term: Maintenance of blood pressure at goal levels.    Lipids  Yes    Intervention  Provide education and support for participant on nutrition & aerobic/resistive exercise along with prescribed medications to achieve LDL <7043mHDL >56m80m  Expected Outcomes  Short Term: Participant states understanding of desired cholesterol values and is compliant with medications prescribed. Participant is following exercise prescription and nutrition guidelines.;Long Term: Cholesterol controlled with medications as prescribed, with individualized exercise RX and with personalized nutrition plan. Value goals: LDL < 70mg53mL >  40 mg.    Stress  Yes Cares for 33 yo son at home. He is  quadriplegic    Intervention  Offer individual and/or small group education and counseling on adjustment to heart disease, stress management and health-related lifestyle change. Teach and support self-help strategies.;Refer participants experiencing significant psychosocial distress to appropriate mental health specialists for further evaluation and treatment. When possible, include family members and significant others in education/counseling sessions.    Expected Outcomes  Short Term: Participant demonstrates changes in health-related behavior, relaxation and other stress management skills, ability to obtain effective social support, and compliance with psychotropic medications if prescribed.;Long Term: Emotional wellbeing is indicated by absence of clinically significant psychosocial distress or social isolation.        Personal Goals Discharge: Goals and Risk Factor Review - 06/24/17 1637      Core Components/Risk Factors/Patient Goals Review   Personal Goals Review  Weight Management/Obesity;Hypertension;Lipids    Review  Tamarra saw her cardiologist yesterday.  Her LDL was 22 - Dr was happy with her progress.  She has lost 10 lb since entering HT program and states she feels great since meeting with the RD about how to eat healthier.    Expected Outcomes  Short - Pt will complete HT program  Long - Pt will maintain fitness on her own       Exercise Goals and Review: Exercise Goals    Row Name 05/03/17 1523             Exercise Goals   Increase Physical Activity  Yes       Intervention  Provide advice, education, support and counseling about physical activity/exercise needs.;Develop an individualized exercise prescription for aerobic and resistive training based on initial evaluation findings, risk stratification, comorbidities and participant's personal goals.       Expected Outcomes  Short Term: Attend rehab on a regular basis to increase amount of physical activity.;Long Term: Add  in home exercise to make exercise part of routine and to increase amount of physical activity.;Long Term: Exercising regularly at least 3-5 days a week.       Increase Strength and Stamina  Yes       Intervention  Provide advice, education, support and counseling about physical activity/exercise needs.;Develop an individualized exercise prescription for aerobic and resistive training based on initial evaluation findings, risk stratification, comorbidities and participant's personal goals.       Expected Outcomes  Short Term: Increase workloads from initial exercise prescription for resistance, speed, and METs.;Long Term: Improve cardiorespiratory fitness, muscular endurance and strength as measured by increased METs and functional capacity (6MWT);Short Term: Perform resistance training exercises routinely during rehab and add in resistance training at home       Able to understand and use rate of perceived exertion (RPE) scale  Yes       Intervention  Provide education and explanation on how to use RPE scale       Expected Outcomes  Short Term: Able to use RPE daily in rehab to express subjective intensity level;Long Term:  Able to use RPE to guide intensity level when exercising independently       Knowledge and understanding of Target Heart Rate Range (THRR)  Yes       Intervention  Provide education and explanation of THRR including how the numbers were predicted and where they are located for reference       Expected Outcomes  Short Term: Able to state/look up THRR;Short Term: Able to use  daily as guideline for intensity in rehab;Long Term: Able to use THRR to govern intensity when exercising independently       Able to check pulse independently  Yes       Intervention  Provide education and demonstration on how to check pulse in carotid and radial arteries.;Review the importance of being able to check your own pulse for safety during independent exercise       Expected Outcomes  Short Term: Able to  explain why pulse checking is important during independent exercise;Long Term: Able to check pulse independently and accurately       Understanding of Exercise Prescription  Yes       Intervention  Provide education, explanation, and written materials on patient's individual exercise prescription       Expected Outcomes  Short Term: Able to explain program exercise prescription;Long Term: Able to explain home exercise prescription to exercise independently          Nutrition & Weight - Outcomes: Pre Biometrics - 05/03/17 1524      Pre Biometrics   Height  _0  (1.651 m)    Weight  179 lb 12.8 oz (81.6 kg)    Waist Circumference  33 inches    Hip Circumference  45 inches    Waist to Hip Ratio  0.73 %    BMI (Calculated)  29.92    Single Leg Stand  25.25 seconds      Post Biometrics - 06/24/17 1636       Post  Biometrics   Height  _1  (1.651 m)    Weight  169 lb 9.6 oz (76.9 kg)    Waist Circumference  32.5 inches    Hip Circumference  45 inches    Waist to Hip Ratio  0.72 %    BMI (Calculated)  28.22    Single Leg Stand  9.5 seconds       Nutrition: Nutrition Therapy & Goals - 05/20/17 1130      Nutrition Therapy   Diet  DASH    Drug/Food Interactions  Statins/Certain Fruits    Protein (specify units)  8oz    Fiber  25 grams    Whole Grain Foods  3 servings    Saturated Fats  13 max. grams    Fruits and Vegetables  6 servings/day 8 ideal    Sodium  1500 grams 1500-2000 recommended by MD per patient      Personal Nutrition Goals   Nutrition Goal  Choose potassium - rich foods daily d/t low potassium. For example, potatoes, avocados and bananas    Personal Goal #2  Practice reading nutrition facts labels to identify sodium and fat content, paying close attention to the types of fats contained in the product IE unsaturated, saturated, trans    Personal Goal #3  Add 1-2 snacks per day, or increase the portions you consume at snacks to combact hunger, as you are well  below your daily sodium limit      Intervention Plan   Intervention  Prescribe, educate and counsel regarding individualized specific dietary modifications aiming towards targeted core components such as weight, hypertension, lipid management, diabetes, heart failure and other comorbidities.;Nutrition handout(s) given to patient. Gave Following a low sodium diet and General guidelines of heart healthy eating handouts    Expected Outcomes  Short Term Goal: Understand basic principles of dietary content, such as calories, fat, sodium, cholesterol and nutrients.;Long Term Goal: Adherence to prescribed nutrition plan.;Short Term Goal: A plan has  been developed with personal nutrition goals set during dietitian appointment.       Nutrition Discharge: Nutrition Assessments - 05/03/17 1308      MEDFICTS Scores   Pre Score  3       Education Questionnaire Score: Knowledge Questionnaire Score - 05/03/17 1332      Knowledge Questionnaire Score   Pre Score  25/28 Reviewed correct responses with Mardene Celeste. She verbalized understanding of the correct responses and had no further questions today       Goals reviewed with patient; copy given to patient.

## 2017-07-01 NOTE — Telephone Encounter (Signed)
New Message   *STAT* If patient is at the pharmacy, call can be transferred to refill team.   1. Which medications need to be refilled? (please list name of each medication and dose if known) atorvastatin (LIPITOR) 80 MG tablet and metoprolol succinate (TOPROL-XL) 25 MG 24 hr tablet  2. Which pharmacy/location (including street and city if local pharmacy) is medication to be sent to? [id number] [place], [place] - [address]  3. Do they need a 30 day or 90 day supply? [place]

## 2017-07-01 NOTE — Telephone Encounter (Signed)
Rx(s) sent to pharmacy electronically.  

## 2017-07-01 NOTE — Progress Notes (Signed)
Discharge Progress Report  Patient Details  Name: Allison Sanders MRN: [id number] Date of Birth: 03-22-1950 Referring Provider:     Cardiac Rehab from 05/03/2017 in [facility] Cardiac and Pulmonary Rehab  Referring Provider  Martinique, Peter MD       Number of Visits: 36/36  Reason for Discharge:  Patient reached a stable level of exercise. Patient independent in their exercise. Patient has met program and personal goals.  Smoking History:  Social History   Tobacco Use  Smoking Status Never Smoker  Smokeless Tobacco Never Used    Diagnosis:  ST elevation myocardial infarction (STEMI), unspecified artery ([place])  ADL UCSD:   Initial Exercise Prescription: Initial Exercise Prescription - 05/03/17 1500      Date of Initial Exercise RX and Referring Provider   Date  05/03/17    Referring Provider  Martinique, Peter MD      Treadmill   MPH  2.1    Grade  0.5    Minutes  15    METs  2.75      NuStep   Level  1    SPM  80    Minutes  15    METs  2.7      Arm Ergometer   Level  1    Watts  38    RPM  25    Minutes  15    METs  2.79      Prescription Details   Frequency (times per week)  3    Duration  Progress to 45 minutes of aerobic exercise without signs/symptoms of physical distress      Intensity   THRR 40-80% of Max Heartrate  95-134    Ratings of Perceived Exertion  11-13    Perceived Dyspnea  0-4      Progression   Progression  Continue to progress workloads to maintain intensity without signs/symptoms of physical distress.      Resistance Training   Training Prescription  Yes    Weight  3    Reps  10-15       Discharge Exercise Prescription (Final Exercise Prescription Changes): Exercise Prescription Changes - 06/23/17 1500      Response to Exercise   Blood Pressure (Admit)  130/80    Blood Pressure (Exercise)  134/64    Blood Pressure (Exit)  130/70    Heart Rate (Admit)  60 bpm    Heart Rate (Exercise)  85 bpm    Heart Rate (Exit)  74 bpm     Rating of Perceived Exertion (Exercise)  12    Symptoms  none    Duration  Continue with 45 min of aerobic exercise without signs/symptoms of physical distress.    Intensity  THRR unchanged      Progression   Progression  Continue to progress workloads to maintain intensity without signs/symptoms of physical distress.    Average METs  2.3      NuStep   Level  3    SPM  80    Minutes  15    METs  2.5      Arm Ergometer   Level  1.5    Minutes  15    METs  2.1       Functional Capacity: 6 Minute Walk    Row Name 05/03/17 1519 06/24/17 1636       6 Minute Walk   Phase  Initial  Discharge    Distance  1115 feet  1342 feet  Distance % Change  -  20 %    Distance Feet Change  -  227 ft    Walk Time  6 minutes  6 minutes    # of Rest Breaks  0  0    MPH  2.11  2.54    METS  2.79  3.19    RPE  10  12    Perceived Dyspnea   -  0    VO2 Peak  9.76  11.17    Symptoms  No  -    Resting HR  56 bpm  65 bpm    Resting BP  126/82  134/78    Resting Oxygen Saturation   100 %  -    Exercise Oxygen Saturation  during 6 min walk  100 %  -    Max Ex. HR  107 bpm  105 bpm    Max Ex. BP  150/68  142/64    2 Minute Post BP  124/70  -       Psychological, QOL, Others - Outcomes: PHQ 2/9: Depression screen [facility] 2/9 07/01/2017 05/03/2017  Decreased Interest 0 0  Down, Depressed, Hopeless 1 0  PHQ - 2 Score 1 0  Altered sleeping 0 0  Change in appetite 1 0  Feeling bad or failure about yourself  0 0  Trouble concentrating 0 0  Moving slowly or fidgety/restless 0 0  Suicidal thoughts 0 0  PHQ-9 Score 2 0  Difficult doing work/chores Not difficult at all Not difficult at all    Quality of Life: Quality of Life - 07/01/17 1725      Quality of Life Scores   Health/Function Pre  25.5 %    Health/Function Post  22.47 %    Health/Function % Change  -11.88 %    Socioeconomic Pre  22.83 %    Socioeconomic Post  21.7 %    Socioeconomic % Change   -4.95 %    Psych/Spiritual Pre   24.21 %    Psych/Spiritual Post  23.79 %    Psych/Spiritual % Change  -1.73 %    Family Pre  27 %    Family Post  23.6 %    Family % Change  -12.59 %    GLOBAL Pre  24.89 %    GLOBAL Post  22.81 %    GLOBAL % Change  -8.36 %       Personal Goals: Goals established at orientation with interventions provided to work toward goal. Personal Goals and Risk Factors at Admission - 05/03/17 1308      Core Components/Risk Factors/Patient Goals on Admission    Weight Management  Obesity;Yes    Intervention  Weight Management: Develop a combined nutrition and exercise program designed to reach desired caloric intake, while maintaining appropriate intake of nutrient and fiber, sodium and fats, and appropriate energy expenditure required for the weight goal.;Weight Management: Provide education and appropriate resources to help participant work on and attain dietary goals.;Weight Management/Obesity: Establish reasonable short term and long term weight goals.;Obesity: Provide education and appropriate resources to help participant work on and attain dietary goals.    Admit Weight  179 lb 12.8 oz (81.6 kg)    Goal Weight: Short Term  177 lb (80.3 kg)    Goal Weight: Long Term  155 lb (70.3 kg)    Expected Outcomes  Short Term: Continue to assess and modify interventions until short term weight is achieved;Weight Loss: Understanding of general recommendations for  a balanced deficit meal plan, which promotes 1-2 lb weight loss per week and includes a negative energy balance of [phone] kcal/d    Hypertension  Yes    Intervention  Provide education on lifestyle modifcations including regular physical activity/exercise, weight management, moderate sodium restriction and increased consumption of fresh fruit, vegetables, and low fat dairy, alcohol moderation, and smoking cessation.;Monitor prescription use compliance.    Expected Outcomes  Short Term: Continued assessment and intervention until BP is < 140/81m  HG in hypertensive participants. < 130/866mHG in hypertensive participants with diabetes, heart failure or chronic kidney disease.;Long Term: Maintenance of blood pressure at goal levels.    Lipids  Yes    Intervention  Provide education and support for participant on nutrition & aerobic/resistive exercise along with prescribed medications to achieve LDL '70mg'$ , HDL >'40mg'$ .    Expected Outcomes  Short Term: Participant states understanding of desired cholesterol values and is compliant with medications prescribed. Participant is following exercise prescription and nutrition guidelines.;Long Term: Cholesterol controlled with medications as prescribed, with individualized exercise RX and with personalized nutrition plan. Value goals: LDL < '70mg'$ , HDL > 40 mg.    Stress  Yes Cares for 3357o son at home. He is quadriplegic    Intervention  Offer individual and/or small group education and counseling on adjustment to heart disease, stress management and health-related lifestyle change. Teach and support self-help strategies.;Refer participants experiencing significant psychosocial distress to appropriate mental health specialists for further evaluation and treatment. When possible, include family members and significant others in education/counseling sessions.    Expected Outcomes  Short Term: Participant demonstrates changes in health-related behavior, relaxation and other stress management skills, ability to obtain effective social support, and compliance with psychotropic medications if prescribed.;Long Term: Emotional wellbeing is indicated by absence of clinically significant psychosocial distress or social isolation.        Personal Goals Discharge: Goals and Risk Factor Review    Row Name 06/02/17 1655 06/24/17 1637           Core Components/Risk Factors/Patient Goals Review   Personal Goals Review  Weight Management/Obesity;Hypertension;Stress;Lipids;Other  Weight  Management/Obesity;Hypertension;Lipids      Review  Wt today is 173lbs blood pressure is 114/64. PaLovettas going to ask her MD if she can start to go back to taking care of her 4967ear old quadaplegic son who had  a car accident 33 years ago. She does his exercises with him, baths, him, takes care of his pressure sore, gets him dressed and up in a chair daily. He does drive she said.   PaElyaw her cardiologist yesterday.  Her LDL was 22 - Dr was happy with her progress.  She has lost 10 lb since entering HT program and states she feels great since meeting with the RD about how to eat healthier.      Expected Outcomes  Cont heart healthy living by having good lipid panel numbers and decrease stress.  Short - Pt will complete HT program  Long - Pt will maintain fitness on her own         Exercise Goals and Review: Exercise Goals    Row Name 05/03/17 1523             Exercise Goals   Increase Physical Activity  Yes       Intervention  Provide advice, education, support and counseling about physical activity/exercise needs.;Develop an individualized exercise prescription for aerobic and resistive training based on initial evaluation findings, risk  stratification, comorbidities and participant's personal goals.       Expected Outcomes  Short Term: Attend rehab on a regular basis to increase amount of physical activity.;Long Term: Add in home exercise to make exercise part of routine and to increase amount of physical activity.;Long Term: Exercising regularly at least 3-5 days a week.       Increase Strength and Stamina  Yes       Intervention  Provide advice, education, support and counseling about physical activity/exercise needs.;Develop an individualized exercise prescription for aerobic and resistive training based on initial evaluation findings, risk stratification, comorbidities and participant's personal goals.       Expected Outcomes  Short Term: Increase workloads from initial exercise  prescription for resistance, speed, and METs.;Long Term: Improve cardiorespiratory fitness, muscular endurance and strength as measured by increased METs and functional capacity (6MWT);Short Term: Perform resistance training exercises routinely during rehab and add in resistance training at home       Able to understand and use rate of perceived exertion (RPE) scale  Yes       Intervention  Provide education and explanation on how to use RPE scale       Expected Outcomes  Short Term: Able to use RPE daily in rehab to express subjective intensity level;Long Term:  Able to use RPE to guide intensity level when exercising independently       Knowledge and understanding of Target Heart Rate Range (THRR)  Yes       Intervention  Provide education and explanation of THRR including how the numbers were predicted and where they are located for reference       Expected Outcomes  Short Term: Able to state/look up THRR;Short Term: Able to use daily as guideline for intensity in rehab;Long Term: Able to use THRR to govern intensity when exercising independently       Able to check pulse independently  Yes       Intervention  Provide education and demonstration on how to check pulse in carotid and radial arteries.;Review the importance of being able to check your own pulse for safety during independent exercise       Expected Outcomes  Short Term: Able to explain why pulse checking is important during independent exercise;Long Term: Able to check pulse independently and accurately       Understanding of Exercise Prescription  Yes       Intervention  Provide education, explanation, and written materials on patient's individual exercise prescription       Expected Outcomes  Short Term: Able to explain program exercise prescription;Long Term: Able to explain home exercise prescription to exercise independently          Nutrition & Weight - Outcomes: Pre Biometrics - 05/03/17 1524      Pre Biometrics   Height  5'  5" (1.651 m)    Weight  179 lb 12.8 oz (81.6 kg)    Waist Circumference  33 inches    Hip Circumference  45 inches    Waist to Hip Ratio  0.73 %    BMI (Calculated)  29.92    Single Leg Stand  25.25 seconds      Post Biometrics - 06/24/17 1636       Post  Biometrics   Height  '5\' 5"'$  (1.651 m)    Weight  169 lb 9.6 oz (76.9 kg)    Waist Circumference  32.5 inches    Hip Circumference  45 inches  Waist to Hip Ratio  0.72 %    BMI (Calculated)  28.22    Single Leg Stand  9.5 seconds       Nutrition: Nutrition Therapy & Goals - 05/20/17 1130      Nutrition Therapy   Diet  DASH    Drug/Food Interactions  Statins/Certain Fruits    Protein (specify units)  8oz    Fiber  25 grams    Whole Grain Foods  3 servings    Saturated Fats  13 max. grams    Fruits and Vegetables  6 servings/day 8 ideal    Sodium  1500 grams 1500-2000 recommended by MD per patient      Personal Nutrition Goals   Nutrition Goal  Choose potassium - rich foods daily d/t low potassium. For example, potatoes, avocados and bananas    Personal Goal #2  Practice reading nutrition facts labels to identify sodium and fat content, paying close attention to the types of fats contained in the product IE unsaturated, saturated, trans    Personal Goal #3  Add 1-2 snacks per day, or increase the portions you consume at snacks to combact hunger, as you are well below your daily sodium limit      Intervention Plan   Intervention  Prescribe, educate and counsel regarding individualized specific dietary modifications aiming towards targeted core components such as weight, hypertension, lipid management, diabetes, heart failure and other comorbidities.;Nutrition handout(s) given to patient. Gave Following a low sodium diet and General guidelines of heart healthy eating handouts    Expected Outcomes  Short Term Goal: Understand basic principles of dietary content, such as calories, fat, sodium, cholesterol and nutrients.;Long Term  Goal: Adherence to prescribed nutrition plan.;Short Term Goal: A plan has been developed with personal nutrition goals set during dietitian appointment.       Nutrition Discharge: Nutrition Assessments - 07/01/17 1716      MEDFICTS Scores   Pre Score  3    Post Score  7    Score Difference  4       Education Questionnaire Score: Knowledge Questionnaire Score - 07/01/17 1714      Knowledge Questionnaire Score   Pre Score  25/28    Post Score  25/28 reviewed with patient       Goals reviewed with patient; copy given to patient.

## 2017-07-01 NOTE — Progress Notes (Signed)
Daily Session Note  Patient Details  Name: AEVA POSEY MRN: [id number] Date of Birth: 08-Sep-1950 Referring Provider:     Cardiac Rehab from 05/03/2017 in [facility] Rehab  Referring Provider  Martinique, Peter MD      Encounter Date: 07/01/2017  Check In: Session Check In - 07/01/17 1635      Check-In   Location  [facility]-Cardiac & Pulmonary Rehab    Staff Present  Justin Mend Jaci Carrel, BS, ACSM CEP, Exercise Physiologist;Meredith Sherryll Burger, RN BSN    Supervising physician immediately available to respond to emergencies  See telemetry face sheet for immediately available ER MD    Medication changes reported      No    Fall or balance concerns reported     No    Tobacco Cessation  No Change    Warm-up and Cool-down  Performed on first and last piece of equipment    Resistance Training Performed  Yes    VAD Patient?  No      Pain Assessment   Currently in Pain?  No/denies          Social History   Tobacco Use  Smoking Status Never Smoker  Smokeless Tobacco Never Used    Goals Met:  Proper associated with RPD/PD & O2 Sat Independence with exercise equipment Exercise tolerated well No report of cardiac concerns or symptoms Strength training completed today  Goals Unmet:  Not Applicable  Comments:  Neyah graduated today from  rehab with 36 sessions completed.  Details of the patient's exercise prescription and what She needs to do in order to continue the prescription and progress were discussed with patient.  Patient was given a copy of prescription and goals.  Patient verbalized understanding.  Deairra plans to continue to exercise by walking and going to [employer].   Dr. Emily Filbert is Medical Director for [facility].

## 2017-07-01 NOTE — Progress Notes (Signed)
Cardiac Individual Treatment Plan  Patient Details  Name: Allison Sanders MRN: [id number] Date of Birth: [birth year] Referring Provider:     Cardiac Rehab from 05/03/2017 in [facility] Cardiac and Pulmonary Rehab  Referring Provider  Martinique, Peter MD      Initial Encounter Date:    Cardiac Rehab from 05/03/2017 in [facility] Cardiac and Pulmonary Rehab  Date  05/03/17  Referring Provider  Martinique, Peter MD      Visit Diagnosis: ST elevation myocardial infarction (STEMI), unspecified artery ([place])  Patient's Home Medications on Admission:  Current Outpatient Medications:  .  aspirin 81 MG chewable tablet, Chew 1 tablet (81 mg total) by mouth daily., Disp: 30 tablet, Rfl: 2 .  atorvastatin (LIPITOR) 80 MG tablet, Take 1 tablet (80 mg total) by mouth daily at 6 PM., Disp: 30 tablet, Rfl: 11 .  cephALEXin (KEFLEX) 500 MG capsule, Take 2 capsules (1,000 mg total) by mouth 2 (two) times daily., Disp: 28 capsule, Rfl: 0 .  cholecalciferol (VITAMIN D) 1000 UNITS tablet, Take 2,000 Units by mouth daily., Disp: , Rfl:  .  irbesartan (AVAPRO) 75 MG tablet, Take 1 tablet (75 mg total) by mouth daily., Disp: 30 tablet, Rfl: 2 .  metoprolol succinate (TOPROL-XL) 25 MG 24 hr tablet, Take 1 tablet (25 mg total) by mouth at bedtime., Disp: 30 tablet, Rfl: 11 .  nitroGLYCERIN (NITROSTAT) 0.4 MG SL tablet, Place 1 tablet (0.4 mg total) under the tongue every 5 (five) minutes x 3 doses as needed for chest pain., Disp: 30 tablet, Rfl: 2 .  spironolactone (ALDACTONE) 25 MG tablet, Take 0.5 tablets (12.5 mg total) by mouth daily., Disp: 30 tablet, Rfl: 2 .  ticagrelor (BRILINTA) 90 MG TABS tablet, Take 1 tablet (90 mg total) by mouth 2 (two) times daily., Disp: 60 tablet, Rfl: 11 .  vitamin C (ASCORBIC ACID) 250 MG tablet, Take 250 mg by mouth daily., Disp: , Rfl:   Past Medical History: Past Medical History:  Diagnosis Date  . HTN (hypertension)   . Kidney stone     Tobacco Use: Social History   Tobacco Use   Smoking Status Never Smoker  Smokeless Tobacco Never Used    Labs: Recent Review Flowsheet Data    Labs for ITP Cardiac and Pulmonary Rehab Latest Ref Rng & Units 04/05/2017 04/05/2017 04/05/2017 06/07/2017   Cholestrol 100 - 199 mg/dL 175 - 167 79(L)   LDLCALC 0 - 99 mg/dL 95 - 91 22   HDL >39 mg/dL 53 - 51 39(L)   Trlycerides 0 - 149 mg/dL 137 - 123 91   Hemoglobin A1c 4.8 - 5.6 % - - 5.7(H) -   TCO2 22 - 32 mmol/L - 24 - -       Exercise Target Goals:    Exercise Program Goal: Individual exercise prescription set using results from initial 6 min walk test and THRR while considering  patient's activity barriers and safety.   Exercise Prescription Goal: Initial exercise prescription builds to 30-45 minutes a day of aerobic activity, 2-3 days per week.  Home exercise guidelines will be given to patient during program as part of exercise prescription that the participant will acknowledge.  Activity Barriers & Risk Stratification: Activity Barriers & Cardiac Risk Stratification - 05/03/17 1333      Activity Barriers & Cardiac Risk Stratification   Activity Barriers  Other (comment)    Comments  Has bladder concerns and was instructed by her physician not to lift. She  voiced that she is  aware to not do any exercise that she feels may hurt her bladder condition.        6 Minute Walk: 6 Minute Walk    Row Name 05/03/17 1519 06/24/17 1636       6 Minute Walk   Phase  Initial  Discharge    Distance  1115 feet  1342 feet    Distance % Change  -  20 %    Distance Feet Change  -  227 ft    Walk Time  6 minutes  6 minutes    # of Rest Breaks  0  0    MPH  2.11  2.54    METS  2.79  3.19    RPE  10  12    Perceived Dyspnea   -  0    VO2 Peak  9.76  11.17    Symptoms  No  -    Resting HR  56 bpm  65 bpm    Resting BP  126/82  134/78    Resting Oxygen Saturation   100 %  -    Exercise Oxygen Saturation  during 6 min walk  100 %  -    Max Ex. HR  107 bpm  105 bpm    Max Ex.  BP  150/68  142/64    2 Minute Post BP  124/70  -       Oxygen Initial Assessment:   Oxygen Re-Evaluation:   Oxygen Discharge (Final Oxygen Re-Evaluation):   Initial Exercise Prescription: Initial Exercise Prescription - 05/03/17 1500      Date of Initial Exercise RX and Referring Provider   Date  05/03/17    Referring Provider  Martinique, Peter MD      Treadmill   MPH  2.1    Grade  0.5    Minutes  15    METs  2.75      NuStep   Level  1    SPM  80    Minutes  15    METs  2.7      Arm Ergometer   Level  1    Watts  38    RPM  25    Minutes  15    METs  2.79      Prescription Details   Frequency (times per week)  3    Duration  Progress to 45 minutes of aerobic exercise without signs/symptoms of physical distress      Intensity   THRR 40-80% of Max Heartrate  95-134    Ratings of Perceived Exertion  11-13    Perceived Dyspnea  0-4      Progression   Progression  Continue to progress workloads to maintain intensity without signs/symptoms of physical distress.      Resistance Training   Training Prescription  Yes    Weight  3    Reps  10-15       Perform Capillary Blood Glucose checks as needed.  Exercise Prescription Changes: Exercise Prescription Changes    Row Name 05/03/17 1500 05/12/17 1200 05/27/17 1600 06/09/17 1400 06/10/17 1000     Response to Exercise   Blood Pressure (Admit)  126/82  126/64  -  120/58  122/62   Blood Pressure (Exercise)  150/68  142/80  -  144/76  140/66   Blood Pressure (Exit)  124/70  112/70  -  130/60  118/64   Heart Rate (Admit)  56 bpm  72 bpm  -  81 bpm  78 bpm   Heart Rate (Exercise)  107 bpm  96 bpm  -  98 bpm  85 bpm   Heart Rate (Exit)  62 bpm  69 bpm  -  78 bpm  73 bpm   Oxygen Saturation (Admit)  100 %  -  -  -  -   Oxygen Saturation (Exercise)  100 %  -  -  -  -   Rating of Perceived Exertion (Exercise)  10  13  -  13  13   Symptoms  none  none  -  none  none   Comments  walk test results  -  -  -  -    Duration  -  Progress to 45 minutes of aerobic exercise without signs/symptoms of physical distress  Progress to 45 minutes of aerobic exercise without signs/symptoms of physical distress  Continue with 45 min of aerobic exercise without signs/symptoms of physical distress.  Continue with 45 min of aerobic exercise without signs/symptoms of physical distress.   Intensity  -  THRR unchanged  THRR unchanged  -  THRR unchanged     Progression   Progression  -  -  -  -  Continue to progress workloads to maintain intensity without signs/symptoms of physical distress.   Average METs  -  2.6  2.6  -  2.2     Resistance Training   Training Prescription  -  Yes  Yes  -  Yes   Weight  -  3 lb  3 lb  -  3 lb   Reps  -  10-15  10-15  -  10-15     Treadmill   MPH  -  2.1  2.1  -  -   Grade  -  0.5  0.5  -  -   Minutes  -  15  15  -  -   METs  -  2.9  2.9  -  -     NuStep   Level  -  1  1  -  3   SPM  -  80  80  -  80   Minutes  -  15  15  -  15   METs  -  2.4  2.4  -  2.2     Arm Ergometer   Level  -  -  -  -  1.5   Minutes  -  -  -  -  15   METs  -  -  -  -  2.1     Home Exercise Plan   Plans to continue exercise at  -  -  Home (comment) walk and do weight training on off days of class  -  -   Frequency  -  -  Add 2 additional days to program exercise sessions.  -  -   Initial Home Exercises Provided  -  -  05/27/17  -  -   [place] Name 06/10/17 1100 06/23/17 1500           Response to Exercise   Blood Pressure (Admit)  -  130/80      Blood Pressure (Exercise)  -  134/64      Blood Pressure (Exit)  -  130/70      Heart Rate (Admit)  -  60 bpm      Heart Rate (Exercise)  -  85 bpm  Heart Rate (Exit)  -  74 bpm      Rating of Perceived Exertion (Exercise)  -  12      Symptoms  -  none      Duration  -  Continue with 45 min of aerobic exercise without signs/symptoms of physical distress.      Intensity  -  THRR unchanged        Progression   Progression  -  Continue to progress  workloads to maintain intensity without signs/symptoms of physical distress.      Average METs  -  2.3        NuStep   Level  -  3      SPM  -  80      Minutes  -  15      METs  -  2.5        Arm Ergometer   Level  -  1.5      Minutes  -  15      METs  -  2.1        Home Exercise Plan   Plans to continue exercise at  Home (comment) walk and do weight training on off days of class  -      Frequency  Add 2 additional days to program exercise sessions.  -      Initial Home Exercises Provided  05/27/17  -         Exercise Comments: Exercise Comments    Row Name 05/10/17 1716 07/01/17 1637         Exercise Comments  First full day of exercise!  Patient was oriented to gym and equipment including functions, settings, policies, and procedures.  Patient's individual exercise prescription and treatment plan were reviewed.  All starting workloads were established based on the results of the 6 minute walk test done at initial orientation visit.  The plan for exercise progression was also introduced and progression will be customized based on patient's performance and goals.  Mikisha graduated today from  rehab with 36 sessions completed.  Details of the patient's exercise prescription and what She needs to do in order to continue the prescription and progress were discussed with patient.  Patient was given a copy of prescription and goals.  Patient verbalized understanding.  Dimitra plans to continue to exercise by walking and going to [employer].         Exercise Goals and Review: Exercise Goals    Row Name 05/03/17 1523             Exercise Goals   Increase Physical Activity  Yes       Intervention  Provide advice, education, support and counseling about physical activity/exercise needs.;Develop an individualized exercise prescription for aerobic and resistive training based on initial evaluation findings, risk stratification, comorbidities and participant's personal goals.        Expected Outcomes  Short Term: Attend rehab on a regular basis to increase amount of physical activity.;Long Term: Add in home exercise to make exercise part of routine and to increase amount of physical activity.;Long Term: Exercising regularly at least 3-5 days a week.       Increase Strength and Stamina  Yes       Intervention  Provide advice, education, support and counseling about physical activity/exercise needs.;Develop an individualized exercise prescription for aerobic and resistive training based on initial evaluation findings, risk stratification, comorbidities and participant's personal goals.  Expected Outcomes  Short Term: Increase workloads from initial exercise prescription for resistance, speed, and METs.;Long Term: Improve cardiorespiratory fitness, muscular endurance and strength as measured by increased METs and functional capacity (6MWT);Short Term: Perform resistance training exercises routinely during rehab and add in resistance training at home       Able to understand and use rate of perceived exertion (RPE) scale  Yes       Intervention  Provide education and explanation on how to use RPE scale       Expected Outcomes  Short Term: Able to use RPE daily in rehab to express subjective intensity level;Long Term:  Able to use RPE to guide intensity level when exercising independently       Knowledge and understanding of Target Heart Rate Range (THRR)  Yes       Intervention  Provide education and explanation of THRR including how the numbers were predicted and where they are located for reference       Expected Outcomes  Short Term: Able to state/look up THRR;Short Term: Able to use daily as guideline for intensity in rehab;Long Term: Able to use THRR to govern intensity when exercising independently       Able to check pulse independently  Yes       Intervention  Provide education and demonstration on how to check pulse in carotid and radial arteries.;Review the importance  of being able to check your own pulse for safety during independent exercise       Expected Outcomes  Short Term: Able to explain why pulse checking is important during independent exercise;Long Term: Able to check pulse independently and accurately       Understanding of Exercise Prescription  Yes       Intervention  Provide education, explanation, and written materials on patient's individual exercise prescription       Expected Outcomes  Short Term: Able to explain program exercise prescription;Long Term: Able to explain home exercise prescription to exercise independently          Exercise Goals Re-Evaluation : Exercise Goals Re-Evaluation    Row Name 05/10/17 1717 05/27/17 1700 06/10/17 1101 06/23/17 1509       Exercise Goal Re-Evaluation   Exercise Goals Review  Increase Physical Activity;Increase Strength and Stamina;Able to understand and use rate of perceived exertion (RPE) scale;Knowledge and understanding of Target Heart Rate Range (THRR)  Increase Physical Activity;Increase Strength and Stamina;Able to understand and use rate of perceived exertion (RPE) scale;Understanding of Exercise Prescription;Knowledge and understanding of Target Heart Rate Range (THRR);Able to check pulse independently  Increase Physical Activity;Increase Strength and Stamina;Able to understand and use rate of perceived exertion (RPE) scale  Increase Physical Activity;Increase Strength and Stamina;Able to understand and use rate of perceived exertion (RPE) scale;Knowledge and understanding of Target Heart Rate Range (THRR)    Comments  Reviewed RPE scale, THR and program prescription with pt today.  Pt voiced understanding and was given a copy of goals to take home.  Home exercise guidelines reviewed with patient. She demonstrated understanding of these guidelines and plans to add 1-2 days of walking outside of class.   Pt is meeting RPE but not HR goals.  Staff will encourage increase in intensity on machines.   Rokia has made steady progress with exercise. She has increased TM speed and grade.      Expected Outcomes  Short: Use RPE daily to regulate intensity.  Long: Follow program prescription in THR.  Short: add 1-2 days of  exercise outside of class. Long: Become independent with exercise routine.   Short - Pt will increase levels on machines Long - Pt will increase overall MET level  Short - Evlyn will increase level on NS and AE Long - Pt will continue to improve MET level       Discharge Exercise Prescription (Final Exercise Prescription Changes): Exercise Prescription Changes - 06/23/17 1500      Response to Exercise   Blood Pressure (Admit)  130/80    Blood Pressure (Exercise)  134/64    Blood Pressure (Exit)  130/70    Heart Rate (Admit)  60 bpm    Heart Rate (Exercise)  85 bpm    Heart Rate (Exit)  74 bpm    Rating of Perceived Exertion (Exercise)  12    Symptoms  none    Duration  Continue with 45 min of aerobic exercise without signs/symptoms of physical distress.    Intensity  THRR unchanged      Progression   Progression  Continue to progress workloads to maintain intensity without signs/symptoms of physical distress.    Average METs  2.3      NuStep   Level  3    SPM  80    Minutes  15    METs  2.5      Arm Ergometer   Level  1.5    Minutes  15    METs  2.1       Nutrition:  Target Goals: Understanding of nutrition guidelines, daily intake of sodium <1515m, cholesterol <2093m calories 30% from fat and 7% or less from saturated fats, daily to have 5 or more servings of fruits and vegetables.  Biometrics: Pre Biometrics - 05/03/17 1524      Pre Biometrics   Height  5' 5" (1.651 m)    Weight  179 lb 12.8 oz (81.6 kg)    Waist Circumference  33 inches    Hip Circumference  45 inches    Waist to Hip Ratio  0.73 %    BMI (Calculated)  29.92    Single Leg Stand  25.25 seconds      Post Biometrics - 06/24/17 1636       Post  Biometrics   Height  5' 5"  (1.651 m)    Weight  169 lb 9.6 oz (76.9 kg)    Waist Circumference  32.5 inches    Hip Circumference  45 inches    Waist to Hip Ratio  0.72 %    BMI (Calculated)  28.22    Single Leg Stand  9.5 seconds       Nutrition Therapy Plan and Nutrition Goals: Nutrition Therapy & Goals - 05/20/17 1130      Nutrition Therapy   Diet  DASH    Drug/Food Interactions  Statins/Certain Fruits    Protein (specify units)  8oz    Fiber  25 grams    Whole Grain Foods  3 servings    Saturated Fats  13 max. grams    Fruits and Vegetables  6 servings/day 8 ideal    Sodium  1500 grams 1500-2000 recommended by MD per patient      Personal Nutrition Goals   Nutrition Goal  Choose potassium - rich foods daily d/t low potassium. For example, potatoes, avocados and bananas    Personal Goal #2  Practice reading nutrition facts labels to identify sodium and fat content, paying close attention to the types of fats contained in the product  IE unsaturated, saturated, trans    Personal Goal #3  Add 1-2 snacks per day, or increase the portions you consume at snacks to combact hunger, as you are well below your daily sodium limit      Intervention Plan   Intervention  Prescribe, educate and counsel regarding individualized specific dietary modifications aiming towards targeted core components such as weight, hypertension, lipid management, diabetes, heart failure and other comorbidities.;Nutrition handout(s) given to patient. Gave Following a low sodium diet and General guidelines of heart healthy eating handouts    Expected Outcomes  Short Term Goal: Understand basic principles of dietary content, such as calories, fat, sodium, cholesterol and nutrients.;Long Term Goal: Adherence to prescribed nutrition plan.;Short Term Goal: A plan has been developed with personal nutrition goals set during dietitian appointment.       Nutrition Assessments: Nutrition Assessments - 07/01/17 1716      MEDFICTS Scores   Pre Score   3    Post Score  7    Score Difference  4       Nutrition Goals Re-Evaluation: Nutrition Goals Re-Evaluation    Row Name 05/20/17 1134 05/20/17 1135 06/02/17 1653         Goals   Current Weight  -  -  173 lb (78.5 kg)     Nutrition Goal  Practice reading nutrition facts labels to identify sodium and fat content, paying close attention to the types of fats contained in the product IE unsaturated, saturated, trans  Consume foods rich in potassium d/t having low potassium. For example, avocados, potatoes and bananas  Is trying to eat more bananas and potatoes     Comment  She reports confusion when trying to interpret the fats section on the label. She has been looking for items low in sodium.  She has started to eat potatoes more frequently since given lab results in February 2019  -     Expected Outcome  Patient will be able to distinguish between the types of dietary fat and will become proficient at knowing what is considered a low sodium item  Patient's potassium will return to WNL  Normal potassium       Personal Goal #2 Re-Evaluation   Personal Goal #2  -  -  Gaby is reading nutrition lables       Personal Goal #3 Re-Evaluation   Personal Goal #3  -  -  controlling hunger        Nutrition Goals Discharge (Final Nutrition Goals Re-Evaluation): Nutrition Goals Re-Evaluation - 06/02/17 1653      Goals   Current Weight  173 lb (78.5 kg)    Nutrition Goal  Is trying to eat more bananas and potatoes    Expected Outcome  Normal potassium      Personal Goal #2 Re-Evaluation   Personal Goal #2  [place] is reading nutrition lables      Personal Goal #3 Re-Evaluation   Personal Goal #3  controlling hunger       Psychosocial: Target Goals: Acknowledge presence or absence of significant depression and/or stress, maximize coping skills, provide positive support system. Participant is able to verbalize types and ability to use techniques and skills needed for reducing stress and  depression.   Initial Review & Psychosocial Screening: Initial Psych Review & Screening - 05/03/17 1324      Initial Review   Current issues with  Current Anxiety/Panic;Current Stress Concerns    Source of Stress Concerns  Financial;Family    Comments  Cares for her  68 yo son at home. he is a quadriplegic.  Is in process of a divorce.     Financial concerns with divorce and medical bills.      Family Dynamics   Good Support System?  Yes Family and Friends      Barriers   Psychosocial barriers to participate in program  There are no identifiable barriers or psychosocial needs.;The patient should benefit from training in stress management and relaxation.      Screening Interventions   Interventions  Encouraged to exercise;Provide feedback about the scores to participant;To provide support and resources with identified psychosocial needs    Expected Outcomes  Short Term goal: Utilizing psychosocial counselor, staff and physician to assist with identification of specific Stressors or current issues interfering with healing process. Setting desired goal for each stressor or current issue identified.;Long Term Goal: Stressors or current issues are controlled or eliminated.;Short Term goal: Identification and review with participant of any Quality of Life or Depression concerns found by scoring the questionnaire.;Long Term goal: The participant improves quality of Life and PHQ9 Scores as seen by post scores and/or verbalization of changes       Quality of Life Scores:  Quality of Life - 07/01/17 1725      Quality of Life Scores   Health/Function Pre  25.5 %    Health/Function Post  22.47 %    Health/Function % Change  -11.88 %    Socioeconomic Pre  22.83 %    Socioeconomic Post  21.7 %    Socioeconomic % Change   -4.95 %    Psych/Spiritual Pre  24.21 %    Psych/Spiritual Post  23.79 %    Psych/Spiritual % Change  -1.73 %    Family Pre  27 %    Family Post  23.6 %    Family % Change   -12.59 %    GLOBAL Pre  24.89 %    GLOBAL Post  22.81 %    GLOBAL % Change  -8.36 %      Scores of 19 and below usually indicate a poorer quality of life in these areas.  A difference of  2-3 points is a clinically meaningful difference.  A difference of 2-3 points in the total score of the Quality of Life Index has been associated with significant improvement in overall quality of life, self-image, physical symptoms, and general health in studies assessing change in quality of life.  PHQ-9: Recent Review Flowsheet Data    Depression screen [facility] 2/9 07/01/2017 05/03/2017   Decreased Interest 0 0   Down, Depressed, Hopeless 1 0   PHQ - 2 Score 1 0   Altered sleeping 0 0   Change in appetite 1 0   Feeling bad or failure about yourself  0 0   Trouble concentrating 0 0   Moving slowly or fidgety/restless 0 0   Suicidal thoughts 0 0   PHQ-9 Score 2 0   Difficult doing work/chores Not difficult at all Not difficult at all     Interpretation of Total Score  Total Score Depression Severity:  1-4 = Minimal depression, 5-9 = Mild depression, 10-14 = Moderate depression, 15-19 = Moderately severe depression, 20-27 = Severe depression   Psychosocial Evaluation and Intervention: Psychosocial Evaluation - 05/10/17 1657      Psychosocial Evaluation & Interventions   Interventions  Encouraged to exercise with the program and follow exercise prescription;Relaxation education;Stress management education    Comments  Counselor  met with [name] Mardene Celeste) today for initial psychsocial evaluation.  She is a 67 year old who had a heart attack on 2/11 with one stent inserted.  Vivien has a strong support system with family who live close by; a son who lives with her and her local church community.  She sleeps well and has a good appetite - although is "challenged" with what she can eat now since her heart attack.   Lemoyne denies a history of depression or anxiety or any current symptoms.  She has a  great deal of stress in her life as she is going through a "sticky divorce" the past 2 years; finances are tight; her own health; her son had an accident 57 years ago and is a quadriplegic and Rikia's mother and grandmother have both passed away in the past 4 years.  Her goals for this program are "to be as healthy as possible".  Counselor educated Ellianah on the benefits of exercise for stress and commended her on her commitment to self-care and her recovery.  Staff will follow her.    Expected Outcomes  Wesleigh will benefit from consistent exercise to achieve her stated goals.   The educational and psychoeducational components of this program will be beneficial as well to understand and manage more positively.  She will exercise for stress management specifically.    Continue Psychosocial Services   Follow up required by staff       Psychosocial Re-Evaluation: Psychosocial Re-Evaluation    [place] Name 06/02/17 1658             Psychosocial Re-Evaluation   Current issues with  Current Stress Concerns       Comments  Pavneet said she realizes that she was under alot of stress after she moved her 14 year old grandmother down here in a nursing home and Alberto said she went daily to check on her grandmother who died. Brae also takes care of her 67 year old quadaplegic son by giving him a bath, dressed, taking care of his pressure sore and doing his exercises with him before she gets him up. Her sister is currently coming over every day to do this while Elayjah is in Cardiac Rehab. Camella is going to ask her MD if she can go back to taking care of her son some. I explained that Cardiac Rehab is working to strenghten her heart more aerobic.       Expected Outcomes  Lauraine said "God is good" and she believes God will get her thru her divorce and all of this. Sequita said her son is very pleasant and has the best attitude.        Interventions  Encouraged to attend Cardiac Rehabilitation  for the exercise       Continue Psychosocial Services   Follow up required by staff          Psychosocial Discharge (Final Psychosocial Re-Evaluation): Psychosocial Re-Evaluation - 06/02/17 1658      Psychosocial Re-Evaluation   Current issues with  Current Stress Concerns    Comments  Cherice said she realizes that she was under alot of stress after she moved her 27 year old grandmother down here in a nursing home and Amando said she went daily to check on her grandmother who died. Lise also takes care of her 67 year old quadaplegic son by giving him a bath, dressed, taking care of his pressure sore and doing his exercises with him before she gets him up. Her  sister is currently coming over every day to do this while Jhoselin is in Cardiac Rehab. Ohana is going to ask her MD if she can go back to taking care of her son some. I explained that Cardiac Rehab is working to strenghten her heart more aerobic.    Expected Outcomes  Loretta said "God is good" and she believes God will get her thru her divorce and all of this. Marshay said her son is very pleasant and has the best attitude.     Interventions  Encouraged to attend Cardiac Rehabilitation for the exercise    Continue Psychosocial Services   Follow up required by staff       Vocational Rehabilitation: Provide vocational rehab assistance to qualifying candidates.   Vocational Rehab Evaluation & Intervention: Vocational Rehab - 05/03/17 1333      Initial Vocational Rehab Evaluation & Intervention   Assessment shows need for Vocational Rehabilitation  No       Education: Education Goals: Education classes will be provided on a variety of topics geared toward better understanding of heart health and risk factor modification. Participant will state understanding/return demonstration of topics presented as noted by education test scores.  Learning Barriers/Preferences: Learning Barriers/Preferences - 05/03/17 1332       Learning Barriers/Preferences   Learning Barriers  None    Learning Preferences  None       Education Topics:  AED/CPR: - Group verbal and written instruction with the use of models to demonstrate the basic use of the AED with the basic ABC's of resuscitation.   Cardiac Rehab from 06/30/2017 in [facility] Cardiac and Pulmonary Rehab  Date  05/26/17  Educator  C.Stollings  Instruction Review Code  1- IT trainer Nutrition Guidelines/Fats and Fiber: -Group instruction provided by verbal, written material, models and posters to present the general guidelines for heart healthy nutrition. Gives an explanation and review of dietary fats and fiber.   Controlling Sodium/Reading Food Labels: -Group verbal and written material supporting the discussion of sodium use in heart healthy nutrition. Review and explanation with models, verbal and written materials for utilization of the food label.   Cardiac Rehab from 06/30/2017 in [facility] Cardiac and Pulmonary Rehab  Date  05/24/17  Educator  PI  Instruction Review Code  1- Verbalizes Understanding      Exercise Physiology & General Exercise Guidelines: - Group verbal and written instruction with models to review the exercise physiology of the cardiovascular system and associated critical values. Provides general exercise guidelines with specific guidelines to those with heart or lung disease.    Cardiac Rehab from 06/30/2017 in [facility] Cardiac and Pulmonary Rehab  Date  06/02/17  Educator  AS  Instruction Review Code  1- Verbalizes Understanding      Aerobic Exercise & Resistance Training: - Gives group verbal and written instruction on the various components of exercise. Focuses on aerobic and resistive training programs and the benefits of this training and how to safely progress through these programs..   Flexibility, Balance, Mind/Body Relaxation: Provides group verbal/written instruction on the benefits of flexibility and  balance training, including mind/body exercise modes such as yoga, pilates and tai chi.  Demonstration and skill practice provided.   Cardiac Rehab from 06/30/2017 in [facility] Cardiac and Pulmonary Rehab  Date  06/14/17  Educator  K.Amedeo Plenty  Instruction Review Code  1- Verbalizes Understanding      Stress and Anxiety: - Provides group verbal and written instruction about the health  risks of elevated stress and causes of high stress.  Discuss the correlation between heart/lung disease and anxiety and treatment options. Review healthy ways to manage with stress and anxiety.   Depression: - Provides group verbal and written instruction on the correlation between heart/lung disease and depressed mood, treatment options, and the stigmas associated with seeking treatment.   Cardiac Rehab from 06/30/2017 in [facility] Cardiac and Pulmonary Rehab  Date  06/09/17  Educator  [facility]  Instruction Review Code  1- Verbalizes Understanding      Anatomy & Physiology of the Heart: - Group verbal and written instruction and models provide basic cardiac anatomy and physiology, with the coronary electrical and arterial systems. Review of Valvular disease and Heart Failure   Cardiac Rehab from 06/30/2017 in [facility] Cardiac and Pulmonary Rehab  Date  05/10/17  Educator  CE  Instruction Review Code  1- Verbalizes Understanding      Cardiac Procedures: - Group verbal and written instruction to review commonly prescribed medications for heart disease. Reviews the medication, class of the drug, and side effects. Includes the steps to properly store meds and maintain the prescription regimen. (beta blockers and nitrates)   Cardiac Rehab from 06/30/2017 in [facility] Cardiac and Pulmonary Rehab  Date  05/19/17  Educator  [facility]  Instruction Review Code  1- Verbalizes Understanding      Cardiac Medications I: - Group verbal and written instruction to review commonly prescribed medications for heart disease. Reviews the medication, class of  the drug, and side effects. Includes the steps to properly store meds and maintain the prescription regimen.   Cardiac Rehab from 06/30/2017 in [facility] Cardiac and Pulmonary Rehab  Date  06/28/17  Educator  CE  Instruction Review Code  1- Verbalizes Understanding      Cardiac Medications II: -Group verbal and written instruction to review commonly prescribed medications for heart disease. Reviews the medication, class of the drug, and side effects. (all other drug classes)   Cardiac Rehab from 06/30/2017 in [facility] Cardiac and Pulmonary Rehab  Date  06/16/17  Educator  [facility]  Instruction Review Code  1- Verbalizes Understanding       Go Sex-Intimacy & Heart Disease, Get SMART - Goal Setting: - Group verbal and written instruction through game format to discuss heart disease and the return to sexual intimacy. Provides group verbal and written material to discuss and apply goal setting through the application of the S.M.A.R.T. Method.   Cardiac Rehab from 06/30/2017 in [facility] Cardiac and Pulmonary Rehab  Date  05/19/17  Educator  [facility]  Instruction Review Code  1- Verbalizes Understanding      Other Matters of the Heart: - Provides group verbal, written materials and models to describe Stable Angina and Peripheral Artery. Includes description of the disease process and treatment options available to the cardiac patient.   Exercise & Equipment Safety: - Individual verbal instruction and demonstration of equipment use and safety with use of the equipment.   Cardiac Rehab from 06/30/2017 in [facility] Cardiac and Pulmonary Rehab  Date  05/03/17  Educator  [facility]  Instruction Review Code  1- Verbalizes Understanding      Infection Prevention: - Provides verbal and written material to individual with discussion of infection control including proper hand washing and proper equipment cleaning during exercise session.   Cardiac Rehab from 06/30/2017 in [facility] Cardiac and Pulmonary Rehab  Date  05/03/17  Educator  [facility]   Instruction Review Code  1- Verbalizes Understanding  Falls Prevention: - Provides verbal and written material to individual with discussion of falls prevention and safety.   Cardiac Rehab from 06/30/2017 in [facility] Cardiac and Pulmonary Rehab  Date  05/03/17  Educator  [facility]  Instruction Review Code  1- Verbalizes Understanding      Diabetes: - Individual verbal and written instruction to review signs/symptoms of diabetes, desired ranges of glucose level fasting, after meals and with exercise. Acknowledge that pre and post exercise glucose checks will be done for 3 sessions at entry of program.   Know Your Numbers and Risk Factors: -Group verbal and written instruction about important numbers in your health.  Discussion of what are risk factors and how they play a role in the disease process.  Review of Cholesterol, Blood Pressure, Diabetes, and BMI and the role they play in your overall health.   Cardiac Rehab from 06/30/2017 in [facility] Cardiac and Pulmonary Rehab  Date  06/16/17  Educator  [facility]  Instruction Review Code  1- Verbalizes Understanding      Sleep Hygiene: -Provides group verbal and written instruction about how sleep can affect your health.  Define sleep hygiene, discuss sleep cycles and impact of sleep habits. Review good sleep hygiene tips.    Cardiac Rehab from 06/30/2017 in [facility] Cardiac and Pulmonary Rehab  Date  05/12/17  Educator  Lucianne Lei, MSW  Instruction Review Code  2- Demonstrated Understanding      Other: -Provides group and verbal instruction on various topics (see comments)   Knowledge Questionnaire Score: Knowledge Questionnaire Score - 07/01/17 1714      Knowledge Questionnaire Score   Pre Score  25/28    Post Score  25/28 reviewed with patient       Core Components/Risk Factors/Patient Goals at Admission: Personal Goals and Risk Factors at Admission - 05/03/17 1308      Core Components/Risk Factors/Patient Goals on Admission    Weight  Management  Obesity;Yes    Intervention  Weight Management: Develop a combined nutrition and exercise program designed to reach desired caloric intake, while maintaining appropriate intake of nutrient and fiber, sodium and fats, and appropriate energy expenditure required for the weight goal.;Weight Management: Provide education and appropriate resources to help participant work on and attain dietary goals.;Weight Management/Obesity: Establish reasonable short term and long term weight goals.;Obesity: Provide education and appropriate resources to help participant work on and attain dietary goals.    Admit Weight  179 lb 12.8 oz (81.6 kg)    Goal Weight: Short Term  177 lb (80.3 kg)    Goal Weight: Long Term  155 lb (70.3 kg)    Expected Outcomes  Short Term: Continue to assess and modify interventions until short term weight is achieved;Weight Loss: Understanding of general recommendations for a balanced deficit meal plan, which promotes 1-2 lb weight loss per week and includes a negative energy balance of [phone] kcal/d    Hypertension  Yes    Intervention  Provide education on lifestyle modifcations including regular physical activity/exercise, weight management, moderate sodium restriction and increased consumption of fresh fruit, vegetables, and low fat dairy, alcohol moderation, and smoking cessation.;Monitor prescription use compliance.    Expected Outcomes  Short Term: Continued assessment and intervention until BP is < 140/52m HG in hypertensive participants. < 130/861mHG in hypertensive participants with diabetes, heart failure or chronic kidney disease.;Long Term: Maintenance of blood pressure at goal levels.    Lipids  Yes    Intervention  Provide education and support for participant  on nutrition & aerobic/resistive exercise along with prescribed medications to achieve LDL <91m, HDL >44m    Expected Outcomes  Short Term: Participant states understanding of desired cholesterol values and  is compliant with medications prescribed. Participant is following exercise prescription and nutrition guidelines.;Long Term: Cholesterol controlled with medications as prescribed, with individualized exercise RX and with personalized nutrition plan. Value goals: LDL < 7067mHDL > 40 mg.    Stress  Yes Cares for 33 30 son at home. He is quadriplegic    Intervention  Offer individual and/or small group education and counseling on adjustment to heart disease, stress management and health-related lifestyle change. Teach and support self-help strategies.;Refer participants experiencing significant psychosocial distress to appropriate mental health specialists for further evaluation and treatment. When possible, include family members and significant others in education/counseling sessions.    Expected Outcomes  Short Term: Participant demonstrates changes in health-related behavior, relaxation and other stress management skills, ability to obtain effective social support, and compliance with psychotropic medications if prescribed.;Long Term: Emotional wellbeing is indicated by absence of clinically significant psychosocial distress or social isolation.       Core Components/Risk Factors/Patient Goals Review:  Goals and Risk Factor Review    Row Name 06/02/17 1655 06/24/17 1637           Core Components/Risk Factors/Patient Goals Review   Personal Goals Review  Weight Management/Obesity;Hypertension;Stress;Lipids;Other  Weight Management/Obesity;Hypertension;Lipids      Review  Wt today is 173lbs blood pressure is 114/64. PatSamaria going to ask her MD if she can start to go back to taking care of her 49 44ar old quadaplegic son who had  a car accident 33 years ago. She does his exercises with him, baths, him, takes care of his pressure sore, gets him dressed and up in a chair daily. He does drive she said.   PatSiobhanw her cardiologist yesterday.  Her LDL was 22 - Dr was happy with her progress.  She  has lost 10 lb since entering HT program and states she feels great since meeting with the RD about how to eat healthier.      Expected Outcomes  Cont heart healthy living by having good lipid panel numbers and decrease stress.  Short - Pt will complete HT program  Long - Pt will maintain fitness on her own         Core Components/Risk Factors/Patient Goals at Discharge (Final Review):  Goals and Risk Factor Review - 06/24/17 1637      Core Components/Risk Factors/Patient Goals Review   Personal Goals Review  Weight Management/Obesity;Hypertension;Lipids    Review  PatHallew her cardiologist yesterday.  Her LDL was 22 - Dr was happy with her progress.  She has lost 10 lb since entering HT program and states she feels great since meeting with the RD about how to eat healthier.    Expected Outcomes  Short - Pt will complete HT program  Long - Pt will maintain fitness on her own       ITP Comments: ITP Comments    Row Name 05/03/17 1302 05/19/17 0649 06/16/17 0626 07/01/17 1651     ITP Comments  Medical review completed today. ITP sent to Dr. MilSabra Heck review, change as needed and to sign. Documentation of diagnosis can be foundin [facility] 04/05/2017 04/22/2017  30 Day review. Continue with ITP unless directed changes per Medical Director review.   New to program  30 day review. Continue with ITP unless directed changes per  Medical Director  Discharge ITP sent and signed by Dr. Sabra Heck.  Discharge Summary routed to PCP and cardiologist.       Comments: Discharge ITP

## 2017-07-05 DIAGNOSIS — Z85528 Personal history of other malignant neoplasm of kidney: Secondary | ICD-10-CM | POA: Diagnosis not present

## 2017-07-05 DIAGNOSIS — N200 Calculus of kidney: Secondary | ICD-10-CM | POA: Diagnosis not present

## 2017-07-05 DIAGNOSIS — N8110 Cystocele, unspecified: Secondary | ICD-10-CM | POA: Diagnosis not present

## 2017-07-05 DIAGNOSIS — R310 Gross hematuria: Secondary | ICD-10-CM | POA: Diagnosis not present

## 2017-07-06 ENCOUNTER — Ambulatory Visit (HOSPITAL_COMMUNITY): Payer: Medicare HMO | Attending: Cardiology

## 2017-07-06 ENCOUNTER — Other Ambulatory Visit: Payer: Self-pay

## 2017-07-06 ENCOUNTER — Other Ambulatory Visit: Payer: Self-pay | Admitting: Internal Medicine

## 2017-07-06 DIAGNOSIS — I255 Ischemic cardiomyopathy: Secondary | ICD-10-CM | POA: Insufficient documentation

## 2017-07-06 DIAGNOSIS — I2510 Atherosclerotic heart disease of native coronary artery without angina pectoris: Secondary | ICD-10-CM | POA: Diagnosis not present

## 2017-07-06 DIAGNOSIS — I10 Essential (primary) hypertension: Secondary | ICD-10-CM | POA: Diagnosis not present

## 2017-07-07 ENCOUNTER — Other Ambulatory Visit: Payer: Self-pay

## 2017-07-07 ENCOUNTER — Other Ambulatory Visit: Payer: Self-pay | Admitting: *Deleted

## 2017-07-07 MED ORDER — IRBESARTAN 75 MG PO TABS: 75.0000 mg | ORAL_TABLET | Freq: Every day | ORAL | 6 refills | Status: DC

## 2017-08-04 ENCOUNTER — Other Ambulatory Visit: Payer: Self-pay | Admitting: Internal Medicine

## 2017-08-05 ENCOUNTER — Other Ambulatory Visit: Payer: Self-pay | Admitting: Cardiology

## 2017-08-09 ENCOUNTER — Encounter: Payer: Self-pay | Admitting: Cardiology

## 2017-08-12 ENCOUNTER — Ambulatory Visit
Admission: RE | Admit: 2017-08-12 | Discharge: 2017-08-12 | Disposition: A | Discharge status: Home / Self Care | Payer: Medicare HMO | Source: Ambulatory Visit | Attending: Obstetrics and Gynecology | Admitting: Obstetrics and Gynecology

## 2017-08-12 DIAGNOSIS — R921 Mammographic calcification found on diagnostic imaging of breast: Secondary | ICD-10-CM

## 2017-08-13 ENCOUNTER — Other Ambulatory Visit: Payer: Self-pay | Admitting: Internal Medicine

## 2017-09-05 ENCOUNTER — Telehealth: Payer: Self-pay | Admitting: Student

## 2017-09-05 NOTE — Telephone Encounter (Addendum)
    The patient called the after-hours line reporting that she developed chest discomfort last night after consuming spaghetti. Her symptoms worsened upon lying down and spontaneously resolved. She did not take any antacids as she was unsure what to take with her current cardiac medications.   Was in her usual state of health until around lunchtime today when she developed a sharp pain along her chest which radiated into her back and would last for only a few seconds at a time. Symptoms are not worse with exertion or positional changes. She has noted discomfort along her left shoulder which has been present throughout the day. Reports symptoms are not similar to what she experienced in 03/2017 when she had an NSTEMI.    At this time, she is currently pain-free. She has sublingual nitroglycerin but has not utilized these.  We reviewed recommendations on how to take these for while her symptoms last night seem most consistent with GERD, it is more difficult to tell the etiology of her symptoms today. If her pain returns I recommended she utilize [facility] NTG and if no improvement, go to the nearest Urgent Care or ED for further evaluation. She voiced understanding of this and was appreciative of the call.   Signed, Erma Heritage, PA-C 09/05/2017, 4:41 PM Pager: [phone]

## 2017-12-18 NOTE — Progress Notes (Signed)
Cardiology Office Note    Date:  12/28/2017   ID:  Allison Sanders, DOB Dec 04, 1950, MRN [id number]  PCP:  Seward Carol, MD  Cardiologist:  Dr. Martinique  Chief Complaint  Patient presents with  . Coronary Artery Disease  . Hypertension  . Hyperlipidemia    History of Present Illness:  Allison Sanders is a 67 y.o. female seen for follow up CAD. She presented in February with anterior STEMI.  Cardiac catheterization performed on 04/05/2017 showed 100% proximal LAD lesion treated with Synergy 3.0 x 20 mm DES, 100% ostial diagonal occlusion, 25% proximal to mid RCA, 30% proximal left circumflex lesion, EF was 50-55% by LV gram.  Follow-up echocardiogram obtained on 04/06/2017 showed EF 35-40%, akinesis of the mid apical anteroseptal, and apical myocardium, grade 2 DD.    Her serial troponin eventually trended up to 4.75.  Post-cath, patient was placed on aspirin, Lipitor, metoprolol and Brilinta.  On her last visit Echo was repeated and showed normalization of LV function.  On follow up today she continues to do very well. Eats a very healthy diet and has lost 40 lbs since her MI. Exercises regularly. Tolerating medication well except finds it hard to swallow lipitor 80 mg. Does not monitor BP at home. She does have kidney stones and reports cystoscopy was OK. She does have significant bladder prolapse.    Past Medical History:  Diagnosis Date  . HTN (hypertension)   . Kidney stone     Past Surgical History:  Procedure Laterality Date  . CORONARY STENT INTERVENTION N/A 04/05/2017   Procedure: CORONARY STENT INTERVENTION;  Surgeon: Martinique, Keri Veale M, MD;  Location: [facility];  Service: Cardiovascular;  Laterality: N/A;  . CORONARY/GRAFT ACUTE MI REVASCULARIZATION N/A 04/05/2017   Procedure: Coronary/Graft Acute MI Revascularization;  Surgeon: Martinique, Meril Dray M, MD;  Location: [facility];  Service: Cardiovascular;  Laterality: N/A;  . DILATION AND CURETTAGE, DIAGNOSTIC /  THERAPEUTIC    . LEFT HEART CATH AND CORONARY ANGIOGRAPHY N/A 04/05/2017   Procedure: LEFT HEART CATH AND CORONARY ANGIOGRAPHY;  Surgeon: Martinique, Librada Castronovo M, MD;  Location: [facility];  Service: Cardiovascular;  Laterality: N/A;  . left nephrectomy      Current Medications: Outpatient Medications Prior to Visit  Medication Sig Dispense Refill  . aspirin 81 MG chewable tablet Chew 1 tablet (81 mg total) by mouth daily. 30 tablet 2  . cholecalciferol (VITAMIN D) 1000 UNITS tablet Take 2,000 Units by mouth daily.    . irbesartan (AVAPRO) 75 MG tablet Take 1 tablet (75 mg total) by mouth daily. 30 tablet 6  . metoprolol succinate (TOPROL-XL) 25 MG 24 hr tablet Take 1 tablet (25 mg total) by mouth at bedtime. 30 tablet 11  . spironolactone (ALDACTONE) 25 MG tablet TAKE 1/2 TABLET BY MOUTH DAILY 30 tablet 5  . ticagrelor (BRILINTA) 90 MG TABS tablet Take 1 tablet (90 mg total) by mouth 2 (two) times daily. 60 tablet 11  . vitamin C (ASCORBIC ACID) 250 MG tablet Take 250 mg by mouth daily.    Marland Kitchen atorvastatin (LIPITOR) 80 MG tablet Take 1 tablet (80 mg total) by mouth daily at 6 PM. 30 tablet 11  . nitroGLYCERIN (NITROSTAT) 0.4 MG SL tablet Place 1 tablet (0.4 mg total) under the tongue every 5 (five) minutes x 3 doses as needed for chest pain. (Patient not taking: Reported on 12/28/2017) 30 tablet 2  . cephALEXin (KEFLEX) 500 MG capsule Take 2 capsules (1,000  mg total) by mouth 2 (two) times daily. 28 capsule 0   No facility-administered medications prior to visit.      Allergies:   Penicillins; Sulfa antibiotics; and Contrast media [iodinated diagnostic agents]   Social History   Socioeconomic History  . Marital status: Single    Spouse name: Not on file  . Number of children: 2  . Years of education: Not on file  . Highest education level: Not on file  Occupational History  . Not on file  Social Needs  . Financial resource strain: Not on file  . Food insecurity:    Worry: Not on  file    Inability: Not on file  . Transportation needs:    Medical: Not on file    Non-medical: Not on file  Tobacco Use  . Smoking status: Never Smoker  . Smokeless tobacco: Never Used  Substance and Sexual Activity  . Alcohol use: No    Alcohol/week: 0.0 standard drinks    Frequency: Never  . Drug use: No  . Sexual activity: Not on file  Lifestyle  . Physical activity:    Days per week: Not on file    Minutes per session: Not on file  . Stress: Not on file  Relationships  . Social connections:    Talks on phone: Not on file    Gets together: Not on file    Attends religious service: Not on file    Active member of club or organization: Not on file    Attends meetings of clubs or organizations: Not on file    Relationship status: Not on file  Other Topics Concern  . Not on file  Social History Narrative  . Not on file     Family History:  The patient's family history includes Heart attack in her father.   ROS:   Please see the history of present illness.    ROS All other systems reviewed and are negative.   PHYSICAL EXAM:   VS:  BP (!) 153/84   Pulse 81   Ht 5\' 5"  (1.651 m)   Wt 152 lb 3.2 oz (69 kg)   BMI 25.33 kg/m    GENERAL:  Well appearing WF in NAD HEENT:  PERRL, EOMI, sclera are clear. Oropharynx is clear. NECK:  No jugular venous distention, carotid upstroke brisk and symmetric, no bruits, no thyromegaly or adenopathy LUNGS:  Clear to auscultation bilaterally CHEST:  Unremarkable HEART:  RRR,  PMI not displaced or sustained,S1 and S2 within normal limits, no S3, no S4: no clicks, no rubs, no murmurs ABD:  Soft, nontender. BS +, no masses or bruits. No hepatomegaly, no splenomegaly EXT:  2 + pulses throughout, no edema, no cyanosis no clubbing SKIN:  Warm and dry.  No rashes NEURO:  Alert and oriented x 3. Cranial nerves II through XII intact. PSYCH:  Cognitively intact    Wt Readings from Last 3 Encounters:  12/28/17 152 lb 3.2 oz (69 kg)    06/24/17 169 lb 9.6 oz (76.9 kg)  06/23/17 168 lb 3.2 oz (76.3 kg)      Studies/Labs Reviewed:   EKG:  EKG is not ordered today.   Recent Labs: 04/07/2017: Magnesium 2.0 05/16/2017: BUN 12; Creatinine, Ser 0.98; Hemoglobin 14.0; Platelets 189; Potassium 4.5; Sodium 139 06/07/2017: ALT 30   Lipid Panel    Component Value Date/Time   CHOL 79 (L) 06/07/2017 0908   TRIG 91 06/07/2017 0908   HDL 39 (L) 06/07/2017 2947  CHOLHDL 2.0 06/07/2017 0908   CHOLHDL 3.3 04/05/2017 2044   VLDL 25 04/05/2017 2044   LDLCALC 22 06/07/2017 0908    Additional studies/ records that were reviewed today include:   Cath 04/05/2017 Conclusion     Prox LAD lesion is 100% stenosed.  A drug-eluting stent was successfully placed using a STENT SYNERGY DES 3X20.  Post intervention, there is a 0% residual stenosis.  Ost 1st Diag lesion is 100% stenosed.  Prox RCA to Mid RCA lesion is 25% stenosed.  Prox Cx lesion is 30% stenosed.  The left ventricular systolic function is normal.  LV end diastolic pressure is normal.  The left ventricular ejection fraction is 50-55% by visual estimate.   1. Single vessel occlusive CAD 2. Mild LV dysfunction with apical wall motion abnormality. 3. Normal LVEDP 4. Successful PCI and stenting of the mid LAD with DES  Plan: DAPT for one year. Risk factor modification.     Echo 04/06/2017 LV EF: 35% -   40%  ------------------------------------------------------------------- Indications:      CAD of native vessels 414.01.  ------------------------------------------------------------------- History:   Risk factors:  Hypertension.  ------------------------------------------------------------------- Study Conclusions  - Left ventricle: The cavity size was normal. Wall thickness was   normal. Systolic function was moderately reduced. The estimated   ejection fraction was in the range of 35% to 40%. Akinesis of the   mid-apicalanteroseptal and  apical myocardium. Features are   consistent with a pseudonormal left ventricular filling pattern,   with concomitant abnormal relaxation and increased filling   pressure (grade 2 diastolic dysfunction).  Echo 07/06/17: Study Conclusions  - Left ventricle: The cavity size was normal. Systolic function was   normal. The estimated ejection fraction was in the range of 55%   to 60%. Wall motion was normal; there were no regional wall   motion abnormalities. Left ventricular diastolic function   parameters were normal. - Aortic valve: Trileaflet; mildly thickened, mildly calcified   leaflets. - Mitral valve: Calcified annulus. Mild thickening and   calcification of the anterior leaflet. There was trivial   regurgitation. - Atrial septum: There was increased thickness of the septum,   consistent with lipomatous hypertrophy. - Pulmonic valve: There was trivial regurgitation.  Impressions:  - Compared to prior echo, LVF has normalized.  ASSESSMENT:    1. Coronary artery disease involving native coronary artery of native heart without angina pectoris   2. Essential hypertension   3. Ischemic cardiomyopathy      PLAN:  In order of problems listed above:  1. CAD: s/p  anterior STEMI with emergent DES of LAD 04/05/17.  Continue aspirin, Brilinta, Lipitor, beta-blocker.  Continue DAPT for one year.  I am really impressed with her weight loss and dedication to healthy eating.   2. Ischemic cardiomyopathy: EF 35-40%. Repeat Echo in May showed normal LV function. On Toprol-XL, spironolactone and irbesartan.  I would consider stopping aldactone at this point but given her elevated BP we will continue.   3. Hypertension: Blood pressure is elevated. I asked that she monitor at home and keep a diary. If needed we could increase irbesartan or aldactone.   4.   HLD excellent control with statin. Will reduce lipitor to 40 mg to make it easier to swallow. Repeat labs in 4 months  Will give flu  shot today.   Medication Adjustments/Labs and Tests Ordered: Current medicines are reviewed at length with the patient today.  Concerns regarding medicines are outlined above.  Medication changes, Labs  and Tests ordered today are listed in the Patient Instructions below. Patient Instructions  We will reduce lipitor to 40 mg daily  Continue your other therapy  Monitor your blood pressure at home and keep a daily diary.  Follow up in 4 months.    Signed, Graycie Halley Martinique, MD  12/28/2017 3:55 PM    [facility] [address], [place], [place]  [address] Phone: [phone]; Fax: [phone]

## 2017-12-28 ENCOUNTER — Encounter: Payer: Self-pay | Admitting: Cardiology

## 2017-12-28 ENCOUNTER — Ambulatory Visit: Payer: Medicare HMO | Admitting: Cardiology

## 2017-12-28 VITALS — BP 153/84 | HR 81 | Ht 65.0 in | Wt 152.2 lb

## 2017-12-28 DIAGNOSIS — I2510 Atherosclerotic heart disease of native coronary artery without angina pectoris: Secondary | ICD-10-CM

## 2017-12-28 DIAGNOSIS — Z23 Encounter for immunization: Secondary | ICD-10-CM | POA: Diagnosis not present

## 2017-12-28 DIAGNOSIS — I255 Ischemic cardiomyopathy: Secondary | ICD-10-CM | POA: Diagnosis not present

## 2017-12-28 DIAGNOSIS — I10 Essential (primary) hypertension: Secondary | ICD-10-CM | POA: Diagnosis not present

## 2017-12-28 MED ORDER — ATORVASTATIN CALCIUM 40 MG PO TABS: 40.0000 mg | ORAL_TABLET | Freq: Every day | ORAL | 3 refills | Status: DC

## 2017-12-28 NOTE — Patient Instructions (Addendum)
We will reduce lipitor to 40 mg daily  Continue your other therapy  Monitor your blood pressure at home and keep a daily diary.  Follow up in 4 months.

## 2017-12-30 DIAGNOSIS — Z6826 Body mass index (BMI) 26.0-26.9, adult: Secondary | ICD-10-CM | POA: Diagnosis not present

## 2017-12-30 DIAGNOSIS — N819 Female genital prolapse, unspecified: Secondary | ICD-10-CM | POA: Diagnosis not present

## 2017-12-30 DIAGNOSIS — Z01419 Encounter for gynecological examination (general) (routine) without abnormal findings: Secondary | ICD-10-CM | POA: Diagnosis not present

## 2018-01-10 DIAGNOSIS — N812 Incomplete uterovaginal prolapse: Secondary | ICD-10-CM | POA: Diagnosis not present

## 2018-01-14 DIAGNOSIS — L0291 Cutaneous abscess, unspecified: Secondary | ICD-10-CM | POA: Diagnosis not present

## 2018-02-09 DIAGNOSIS — N819 Female genital prolapse, unspecified: Secondary | ICD-10-CM | POA: Diagnosis not present

## 2018-03-30 ENCOUNTER — Other Ambulatory Visit (HOSPITAL_COMMUNITY): Payer: Self-pay | Admitting: Internal Medicine

## 2018-03-31 ENCOUNTER — Telehealth: Payer: Self-pay | Admitting: Cardiology

## 2018-03-31 NOTE — Telephone Encounter (Signed)
Spoke with patient and she only needs 4 tablets to complete coarse. 1 bottle of Brilinta 90 mg Lot # [id number] exp 8/22 at front for pick up

## 2018-03-31 NOTE — Telephone Encounter (Signed)
New Message   Patient calling the office for samples of medication:   1.  What medication and dosage are you requesting samples for? ticagrelor (BRILINTA) 90 MG TABS tablet   2.  Are you currently out of this medication? Will be out on the 9th/

## 2018-04-07 ENCOUNTER — Encounter: Payer: Self-pay | Admitting: Cardiology

## 2018-04-20 DIAGNOSIS — I255 Ischemic cardiomyopathy: Secondary | ICD-10-CM | POA: Diagnosis not present

## 2018-04-20 DIAGNOSIS — I2510 Atherosclerotic heart disease of native coronary artery without angina pectoris: Secondary | ICD-10-CM | POA: Diagnosis not present

## 2018-04-20 DIAGNOSIS — I10 Essential (primary) hypertension: Secondary | ICD-10-CM | POA: Diagnosis not present

## 2018-04-20 LAB — BASIC METABOLIC PANEL
BUN/Creatinine Ratio: 18 (ref 12–28)
BUN: 17 mg/dL (ref 8–27)
CHLORIDE: 104 mmol/L (ref 96–106)
CO2: 23 mmol/L (ref 20–29)
Calcium: 9.9 mg/dL (ref 8.7–10.3)
Creatinine, Ser: 0.95 mg/dL (ref 0.57–1.00)
GFR calc Af Amer: 71 mL/min/{1.73_m2} (ref >59)
GFR calc non Af Amer: 62 mL/min/{1.73_m2} (ref >59)
GLUCOSE: 87 mg/dL (ref 65–99)
POTASSIUM: 4.7 mmol/L (ref 3.5–5.2)
Sodium: 142 mmol/L (ref 134–144)

## 2018-04-20 LAB — LIPID PANEL W/O CHOL/HDL RATIO
CHOLESTEROL TOTAL: 93 mg/dL — AB (ref 100–199)
HDL: 49 mg/dL (ref >39)
LDL Calculated: 32 mg/dL (ref 0–99)
TRIGLYCERIDES: 58 mg/dL (ref 0–149)
VLDL Cholesterol Cal: 12 mg/dL (ref 5–40)

## 2018-04-20 LAB — HEPATIC FUNCTION PANEL
ALK PHOS: 81 IU/L (ref 39–117)
ALT: 34 IU/L — ABNORMAL HIGH (ref 0–32)
AST: 29 IU/L (ref 0–40)
Albumin: 4.2 g/dL (ref 3.8–4.8)
BILIRUBIN, DIRECT: 0.17 mg/dL (ref 0.00–0.40)
Bilirubin Total: 0.6 mg/dL (ref 0.0–1.2)
Total Protein: 6.6 g/dL (ref 6.0–8.5)

## 2018-04-21 NOTE — Progress Notes (Signed)
Cardiology Office Note    Date:  04/25/2018   ID:  Allison Sanders, DOB 1950-05-03, MRN [id number]  PCP:  Seward Carol, MD  Cardiologist:  Dr. Martinique  Chief Complaint  Patient presents with  . Coronary Artery Disease    History of Present Illness:  Allison Sanders is a 68 y.o. female seen for follow up CAD. She presented in February 2019 with anterior STEMI.  Cardiac catheterization performed on 04/05/2017 showed 100% proximal LAD lesion treated with Synergy 3.0 x 20 mm DES, 100% ostial diagonal occlusion, 25% proximal to mid RCA, 30% proximal left circumflex lesion, EF was 50-55% by LV gram.  Follow-up echocardiogram obtained on 04/06/2017 showed EF 35-40%, akinesis of the mid apical anteroseptal, and apical myocardium, grade 2 DD.    Her serial troponin eventually trended up to 4.75.  Post-cath, patient was placed on aspirin, Lipitor, metoprolol and Brilinta.  In May 2019 Echo was repeated and showed normalization of LV function.  On follow up today she continues to do very well. Eats a very healthy diet and has lost even more weight. Exercises regularly. Tolerating medication well. Denies any chest pain, dyspnea, palpitations. BP has been well controlled. Is now off Brilinta.   She does have significant bladder prolapse and is going to have a pesary.    Past Medical History:  Diagnosis Date  . HTN (hypertension)   . Kidney stone     Past Surgical History:  Procedure Laterality Date  . CORONARY STENT INTERVENTION N/A 04/05/2017   Procedure: CORONARY STENT INTERVENTION;  Surgeon: Martinique, Peter M, MD;  Location: [facility];  Service: Cardiovascular;  Laterality: N/A;  . CORONARY/GRAFT ACUTE MI REVASCULARIZATION N/A 04/05/2017   Procedure: Coronary/Graft Acute MI Revascularization;  Surgeon: Martinique, Peter M, MD;  Location: [facility];  Service: Cardiovascular;  Laterality: N/A;  . DILATION AND CURETTAGE, DIAGNOSTIC / THERAPEUTIC    . LEFT HEART CATH AND CORONARY  ANGIOGRAPHY N/A 04/05/2017   Procedure: LEFT HEART CATH AND CORONARY ANGIOGRAPHY;  Surgeon: Martinique, Peter M, MD;  Location: [facility];  Service: Cardiovascular;  Laterality: N/A;  . left nephrectomy      Current Medications: Outpatient Medications Prior to Visit  Medication Sig Dispense Refill  . aspirin 81 MG chewable tablet Chew 1 tablet (81 mg total) by mouth daily. 30 tablet 2  . atorvastatin (LIPITOR) 40 MG tablet Take 1 tablet (40 mg total) by mouth daily. 90 tablet 3  . cholecalciferol (VITAMIN D) 1000 UNITS tablet Take 2,000 Units by mouth daily.    . irbesartan (AVAPRO) 75 MG tablet Take 1 tablet (75 mg total) by mouth daily. 30 tablet 6  . metoprolol succinate (TOPROL-XL) 25 MG 24 hr tablet Take 1 tablet (25 mg total) by mouth at bedtime. 30 tablet 11  . spironolactone (ALDACTONE) 25 MG tablet TAKE 1/2 TABLET BY MOUTH DAILY 30 tablet 5  . vitamin C (ASCORBIC ACID) 250 MG tablet Take 250 mg by mouth daily.    . nitroGLYCERIN (NITROSTAT) 0.4 MG SL tablet Place 1 tablet (0.4 mg total) under the tongue every 5 (five) minutes x 3 doses as needed for chest pain. (Patient not taking: Reported on 04/25/2018) 30 tablet 2  . ticagrelor (BRILINTA) 90 MG TABS tablet Take 1 tablet (90 mg total) by mouth 2 (two) times daily. 60 tablet 11   No facility-administered medications prior to visit.      Allergies:   Penicillins; Sulfa antibiotics; and Contrast media [iodinated  diagnostic agents]   Social History   Socioeconomic History  . Marital status: Single    Spouse name: Not on file  . Number of children: 2  . Years of education: Not on file  . Highest education level: Not on file  Occupational History  . Not on file  Social Needs  . Financial resource strain: Not on file  . Food insecurity:    Worry: Not on file    Inability: Not on file  . Transportation needs:    Medical: Not on file    Non-medical: Not on file  Tobacco Use  . Smoking status: Never Smoker  . Smokeless  tobacco: Never Used  Substance and Sexual Activity  . Alcohol use: No    Alcohol/week: 0.0 standard drinks    Frequency: Never  . Drug use: No  . Sexual activity: Not on file  Lifestyle  . Physical activity:    Days per week: Not on file    Minutes per session: Not on file  . Stress: Not on file  Relationships  . Social connections:    Talks on phone: Not on file    Gets together: Not on file    Attends religious service: Not on file    Active member of club or organization: Not on file    Attends meetings of clubs or organizations: Not on file    Relationship status: Not on file  Other Topics Concern  . Not on file  Social History Narrative  . Not on file     Family History:  The patient's family history includes Heart attack in her father.   ROS:   Please see the history of present illness.    ROS All other systems reviewed and are negative.   PHYSICAL EXAM:   VS:  BP 128/74   Pulse (!) 57   Ht 5\' 5"  (1.651 m)   Wt 147 lb (66.7 kg)   BMI 24.46 kg/m    GENERAL:  Well appearing WF in NAD HEENT:  PERRL, EOMI, sclera are clear. Oropharynx is clear. NECK:  No jugular venous distention, carotid upstroke brisk and symmetric, no bruits, no thyromegaly or adenopathy LUNGS:  Clear to auscultation bilaterally CHEST:  Unremarkable HEART:  RRR,  PMI not displaced or sustained,S1 and S2 within normal limits, no S3, no S4: no clicks, no rubs, no murmurs ABD:  Soft, nontender. BS +, no masses or bruits. No hepatomegaly, no splenomegaly EXT:  2 + pulses throughout, no edema, no cyanosis no clubbing SKIN:  Warm and dry.  No rashes NEURO:  Alert and oriented x 3. Cranial nerves II through XII intact. PSYCH:  Cognitively intact      Wt Readings from Last 3 Encounters:  04/25/18 147 lb (66.7 kg)  12/28/17 152 lb 3.2 oz (69 kg)  06/24/17 169 lb 9.6 oz (76.9 kg)      Studies/Labs Reviewed:   EKG:  EKG is ordered today. Sinus brady rate 57. Normal. I have personally  reviewed and interpreted this study.   Recent Labs: 05/16/2017: Hemoglobin 14.0; Platelets 189 04/20/2018: ALT 34; BUN 17; Creatinine, Ser 0.95; Potassium 4.7; Sodium 142   Lipid Panel    Component Value Date/Time   CHOL 93 (L) 04/20/2018 0901   TRIG 58 04/20/2018 0901   HDL 49 04/20/2018 0901   CHOLHDL 2.0 06/07/2017 0908   CHOLHDL 3.3 04/05/2017 2044   VLDL 25 04/05/2017 2044   LDLCALC 32 04/20/2018 0901    Additional studies/ records that  were reviewed today include:   Cath 04/05/2017 Conclusion     Prox LAD lesion is 100% stenosed.  A drug-eluting stent was successfully placed using a STENT SYNERGY DES 3X20.  Post intervention, there is a 0% residual stenosis.  Ost 1st Diag lesion is 100% stenosed.  Prox RCA to Mid RCA lesion is 25% stenosed.  Prox Cx lesion is 30% stenosed.  The left ventricular systolic function is normal.  LV end diastolic pressure is normal.  The left ventricular ejection fraction is 50-55% by visual estimate.   1. Single vessel occlusive CAD 2. Mild LV dysfunction with apical wall motion abnormality. 3. Normal LVEDP 4. Successful PCI and stenting of the mid LAD with DES  Plan: DAPT for one year. Risk factor modification.     Echo 04/06/2017 LV EF: 35% -   40%  ------------------------------------------------------------------- Indications:      CAD of native vessels 414.01.  ------------------------------------------------------------------- History:   Risk factors:  Hypertension.  ------------------------------------------------------------------- Study Conclusions  - Left ventricle: The cavity size was normal. Wall thickness was   normal. Systolic function was moderately reduced. The estimated   ejection fraction was in the range of 35% to 40%. Akinesis of the   mid-apicalanteroseptal and apical myocardium. Features are   consistent with a pseudonormal left ventricular filling pattern,   with concomitant abnormal  relaxation and increased filling   pressure (grade 2 diastolic dysfunction).  Echo 07/06/17: Study Conclusions  - Left ventricle: The cavity size was normal. Systolic function was   normal. The estimated ejection fraction was in the range of 55%   to 60%. Wall motion was normal; there were no regional wall   motion abnormalities. Left ventricular diastolic function   parameters were normal. - Aortic valve: Trileaflet; mildly thickened, mildly calcified   leaflets. - Mitral valve: Calcified annulus. Mild thickening and   calcification of the anterior leaflet. There was trivial   regurgitation. - Atrial septum: There was increased thickness of the septum,   consistent with lipomatous hypertrophy. - Pulmonic valve: There was trivial regurgitation.  Impressions:  - Compared to prior echo, LVF has normalized.  ASSESSMENT:    1. Ischemic cardiomyopathy   2. Coronary artery disease involving native coronary artery of native heart without angina pectoris   3. Essential hypertension      PLAN:  In order of problems listed above:  1. CAD: s/p  anterior STEMI with emergent DES of LAD 04/05/17.  Continue aspirin,  Lipitor, beta-blocker. Off Brilinta.  She has done a fabulous job of lifestyle modification.  2. Ischemic cardiomyopathy: EF 35-40%. Repeat Echo in May showed normal LV function. On Toprol-XL, spironolactone and irbesartan. Will continue  3. Hypertension: Blood pressure is well controlled now.  4.   HLD excellent control with statin.   Follow up in 6 months.   Medication Adjustments/Labs and Tests Ordered: Current medicines are reviewed at length with the patient today.  Concerns regarding medicines are outlined above.  Medication changes, Labs and Tests ordered today are listed in the Patient Instructions below. There are no Patient Instructions on file for this visit.   Signed, Peter Martinique, MD  04/25/2018 2:44 PM    [facility] [address], [place], [place]  [address] Phone: [phone]; Fax: [phone]

## 2018-04-25 ENCOUNTER — Ambulatory Visit: Payer: Medicare HMO | Admitting: Cardiology

## 2018-04-25 ENCOUNTER — Encounter: Payer: Self-pay | Admitting: Cardiology

## 2018-04-25 ENCOUNTER — Encounter (INDEPENDENT_AMBULATORY_CARE_PROVIDER_SITE_OTHER): Payer: Self-pay

## 2018-04-25 ENCOUNTER — Other Ambulatory Visit: Payer: Self-pay

## 2018-04-25 VITALS — BP 128/74 | HR 57 | Ht 65.0 in | Wt 147.0 lb

## 2018-04-25 DIAGNOSIS — I255 Ischemic cardiomyopathy: Secondary | ICD-10-CM

## 2018-04-25 DIAGNOSIS — I10 Essential (primary) hypertension: Secondary | ICD-10-CM

## 2018-04-25 DIAGNOSIS — I2510 Atherosclerotic heart disease of native coronary artery without angina pectoris: Secondary | ICD-10-CM

## 2018-04-25 MED ORDER — NITROGLYCERIN 0.4 MG SL SUBL: 0.4000 mg | SUBLINGUAL_TABLET | SUBLINGUAL | 2 refills | Status: DC | PRN

## 2018-04-25 MED ORDER — NITROGLYCERIN 0.4 MG SL SUBL: 0.4000 mg | SUBLINGUAL_TABLET | SUBLINGUAL | 11 refills | Status: DC | PRN

## 2018-04-27 DIAGNOSIS — N819 Female genital prolapse, unspecified: Secondary | ICD-10-CM | POA: Diagnosis not present

## 2018-06-23 ENCOUNTER — Other Ambulatory Visit: Payer: Self-pay

## 2018-06-23 MED ORDER — IRBESARTAN 75 MG PO TABS: 75.0000 mg | ORAL_TABLET | Freq: Every day | ORAL | 6 refills | Status: DC

## 2018-06-30 ENCOUNTER — Other Ambulatory Visit: Payer: Self-pay

## 2018-06-30 MED ORDER — METOPROLOL SUCCINATE ER 25 MG PO TB24: 25.0000 mg | ORAL_TABLET | Freq: Every day | ORAL | 11 refills | Status: DC

## 2018-08-04 DIAGNOSIS — N819 Female genital prolapse, unspecified: Secondary | ICD-10-CM | POA: Diagnosis not present

## 2018-08-14 ENCOUNTER — Other Ambulatory Visit: Payer: Self-pay | Admitting: Cardiology

## 2018-11-03 DIAGNOSIS — N819 Female genital prolapse, unspecified: Secondary | ICD-10-CM | POA: Diagnosis not present

## 2018-11-15 ENCOUNTER — Telehealth: Payer: Self-pay | Admitting: Cardiology

## 2018-11-15 NOTE — Telephone Encounter (Signed)
Called patient, advised of message from PharmD.  Patient verbalized understanding.   

## 2018-11-15 NOTE — Telephone Encounter (Signed)
Her blood pressure is actually not low but at desired goal.  She can change irbesartan  administration to every evening (after supper) to improve symptoms or can take 1/2 table after breakfast and 1/2 tablet at bedtime.

## 2018-11-15 NOTE — Telephone Encounter (Signed)
Patient states that she is fatigue and found herself having no energy. She states that she did improve her diet and that started to make her feel better but now she is back to not feeling well. She normally take the Irbesartan in the morning, and the Metoprolol at night- these are her recent readings from this morning:  116/70 this morning Rechecked after breakfast 120/71  Then time to take BP medication, but she has held it today to see what advice she will have at this time if she should take it and monitor BP or not take it and monitor BP. Advised I would route message to PharmD and MD.

## 2018-11-15 NOTE — Telephone Encounter (Signed)
° ° °  Pt c/o medication issue:  1. Name of Medication: irbesartan (AVAPRO) 75 MG tablet    2. How are you currently taking this medication (dosage and times per day)? As written  3. Are you having a reaction (difficulty breathing--STAT)? no  4. What is your medication issue? After taking medication feels sick  BP 115/54, 120/70. Feels BP too low at times

## 2018-11-17 NOTE — Progress Notes (Signed)
Cardiology Office Note    Date:  11/21/2018   ID:  Allison Sanders, DOB 1950-04-18, MRN [id number]  PCP:  Seward Carol, MD  Cardiologist:  Dr. Martinique  Chief Complaint  Patient presents with  . Coronary Artery Disease  . Hypertension    History of Present Illness:  Allison Sanders is a 67 y.o. female seen for follow up CAD. She presented in February 2019 with anterior STEMI.  Cardiac catheterization performed on 04/05/2017 showed 100% proximal LAD lesion treated with Synergy 3.0 x 20 mm DES, 100% ostial diagonal occlusion, 25% proximal to mid RCA, 30% proximal left circumflex lesion, EF was 50-55% by LV gram.  Follow-up echocardiogram obtained on 04/06/2017 showed EF 35-40%, akinesis of the mid apical anteroseptal, and apical myocardium, grade 2 DD.    Her serial troponin eventually trended up to 4.75.  Post-cath, patient was placed on aspirin, Lipitor, metoprolol and Brilinta.  In May 2019 Echo was repeated and showed normalization of LV function.  On follow up today she is doing OK. She does note that sometimes she feels wiped out during the day. Wonders about her medication.  Eats a very healthy diet. Exercises regularly. Tolerating medication well. Denies any chest pain, dyspnea, palpitations. BP has been well controlled.    Past Medical History:  Diagnosis Date  . HTN (hypertension)   . Kidney stone     Past Surgical History:  Procedure Laterality Date  . CORONARY STENT INTERVENTION N/A 04/05/2017   Procedure: CORONARY STENT INTERVENTION;  Surgeon: Martinique, Seeley Hissong M, MD;  Location: [facility];  Service: Cardiovascular;  Laterality: N/A;  . CORONARY/GRAFT ACUTE MI REVASCULARIZATION N/A 04/05/2017   Procedure: Coronary/Graft Acute MI Revascularization;  Surgeon: Martinique, Cypress Hinkson M, MD;  Location: [facility];  Service: Cardiovascular;  Laterality: N/A;  . DILATION AND CURETTAGE, DIAGNOSTIC / THERAPEUTIC    . LEFT HEART CATH AND CORONARY ANGIOGRAPHY N/A 04/05/2017    Procedure: LEFT HEART CATH AND CORONARY ANGIOGRAPHY;  Surgeon: Martinique, Seaira Byus M, MD;  Location: [facility];  Service: Cardiovascular;  Laterality: N/A;  . left nephrectomy      Current Medications: Outpatient Medications Prior to Visit  Medication Sig Dispense Refill  . aspirin 81 MG chewable tablet Chew 1 tablet (81 mg total) by mouth daily. 30 tablet 2  . atorvastatin (LIPITOR) 40 MG tablet Take 1 tablet (40 mg total) by mouth daily. 90 tablet 3  . cholecalciferol (VITAMIN D) 1000 UNITS tablet Take 2,000 Units by mouth daily.    . irbesartan (AVAPRO) 75 MG tablet Take 1 tablet (75 mg total) by mouth daily. 30 tablet 6  . metoprolol succinate (TOPROL-XL) 25 MG 24 hr tablet Take 1 tablet (25 mg total) by mouth at bedtime. 30 tablet 11  . nitroGLYCERIN (NITROSTAT) 0.4 MG SL tablet Place 1 tablet (0.4 mg total) under the tongue every 5 (five) minutes x 3 doses as needed for chest pain. 25 tablet 11  . spironolactone (ALDACTONE) 25 MG tablet TAKE 1/2 TABLET BY MOUTH DAILY 45 tablet 3  . vitamin C (ASCORBIC ACID) 250 MG tablet Take 250 mg by mouth daily.     No facility-administered medications prior to visit.      Allergies:   Penicillins, Sulfa antibiotics, and Contrast media [iodinated diagnostic agents]   Social History   Socioeconomic History  . Marital status: Single    Spouse name: Not on file  . Number of children: 2  . Years of education: Not on  file  . Highest education level: Not on file  Occupational History  . Not on file  Social Needs  . Financial resource strain: Not on file  . Food insecurity    Worry: Not on file    Inability: Not on file  . Transportation needs    Medical: Not on file    Non-medical: Not on file  Tobacco Use  . Smoking status: Never Smoker  . Smokeless tobacco: Never Used  Substance and Sexual Activity  . Alcohol use: No    Alcohol/week: 0.0 standard drinks    Frequency: Never  . Drug use: No  . Sexual activity: Not on file   Lifestyle  . Physical activity    Days per week: Not on file    Minutes per session: Not on file  . Stress: Not on file  Relationships  . Social Herbalist on phone: Not on file    Gets together: Not on file    Attends religious service: Not on file    Active member of club or organization: Not on file    Attends meetings of clubs or organizations: Not on file    Relationship status: Not on file  Other Topics Concern  . Not on file  Social History Narrative  . Not on file     Family History:  The patient's family history includes Heart attack in her father.   ROS:   Please see the history of present illness.    ROS All other systems reviewed and are negative.   PHYSICAL EXAM:   VS:  BP (!) 143/67   Pulse 73   Temp (!) 97.1 F (36.2 C)   Ht 5\' 5"  (1.651 m)   Wt 151 lb (68.5 kg)   SpO2 99%   BMI 25.13 kg/m    GENERAL:  Well appearing WF in NAD HEENT:  PERRL, EOMI, sclera are clear. Oropharynx is clear. NECK:  No jugular venous distention, carotid upstroke brisk and symmetric, no bruits, no thyromegaly or adenopathy LUNGS:  Clear to auscultation bilaterally CHEST:  Unremarkable HEART:  RRR,  PMI not displaced or sustained,S1 and S2 within normal limits, no S3, no S4: no clicks, no rubs, no murmurs ABD:  Soft, nontender. BS +, no masses or bruits. No hepatomegaly, no splenomegaly EXT:  2 + pulses throughout, no edema, no cyanosis no clubbing SKIN:  Warm and dry.  No rashes NEURO:  Alert and oriented x 3. Cranial nerves II through XII intact. PSYCH:  Cognitively intact      Wt Readings from Last 3 Encounters:  11/21/18 151 lb (68.5 kg)  04/25/18 147 lb (66.7 kg)  12/28/17 152 lb 3.2 oz (69 kg)      Studies/Labs Reviewed:   EKG:  EKG is not ordered today.    Recent Labs: 04/20/2018: ALT 34; BUN 17; Creatinine, Ser 0.95; Potassium 4.7; Sodium 142   Lipid Panel    Component Value Date/Time   CHOL 93 (L) 04/20/2018 0901   TRIG 58 04/20/2018 0901    HDL 49 04/20/2018 0901   CHOLHDL 2.0 06/07/2017 0908   CHOLHDL 3.3 04/05/2017 2044   VLDL 25 04/05/2017 2044   LDLCALC 32 04/20/2018 0901    Additional studies/ records that were reviewed today include:   Cath 04/05/2017 Conclusion     Prox LAD lesion is 100% stenosed.  A drug-eluting stent was successfully placed using a STENT SYNERGY DES 3X20.  Post intervention, there is a 0% residual stenosis.  Ost 1st Diag lesion is 100% stenosed.  Prox RCA to Mid RCA lesion is 25% stenosed.  Prox Cx lesion is 30% stenosed.  The left ventricular systolic function is normal.  LV end diastolic pressure is normal.  The left ventricular ejection fraction is 50-55% by visual estimate.   1. Single vessel occlusive CAD 2. Mild LV dysfunction with apical wall motion abnormality. 3. Normal LVEDP 4. Successful PCI and stenting of the mid LAD with DES  Plan: DAPT for one year. Risk factor modification.     Echo 04/06/2017 LV EF: 35% -   40%  ------------------------------------------------------------------- Indications:      CAD of native vessels 414.01.  ------------------------------------------------------------------- History:   Risk factors:  Hypertension.  ------------------------------------------------------------------- Study Conclusions  - Left ventricle: The cavity size was normal. Wall thickness was   normal. Systolic function was moderately reduced. The estimated   ejection fraction was in the range of 35% to 40%. Akinesis of the   mid-apicalanteroseptal and apical myocardium. Features are   consistent with a pseudonormal left ventricular filling pattern,   with concomitant abnormal relaxation and increased filling   pressure (grade 2 diastolic dysfunction).  Echo 07/06/17: Study Conclusions  - Left ventricle: The cavity size was normal. Systolic function was   normal. The estimated ejection fraction was in the range of 55%   to 60%. Wall motion was  normal; there were no regional wall   motion abnormalities. Left ventricular diastolic function   parameters were normal. - Aortic valve: Trileaflet; mildly thickened, mildly calcified   leaflets. - Mitral valve: Calcified annulus. Mild thickening and   calcification of the anterior leaflet. There was trivial   regurgitation. - Atrial septum: There was increased thickness of the septum,   consistent with lipomatous hypertrophy. - Pulmonic valve: There was trivial regurgitation.  Impressions:  - Compared to prior echo, LVF has normalized.  ASSESSMENT:    1. Coronary artery disease involving native coronary artery of native heart without angina pectoris   2. Essential hypertension      PLAN:  In order of problems listed above:  1. CAD: s/p  anterior STEMI with emergent DES of LAD 04/05/17.  Continue aspirin,  Lipitor, beta-blocker. Continue lifestyle modification  2. Ischemic cardiomyopathy: EF 35-40%. Repeat Echo in May showed normal LV function. With resolution of LV dysfunction we will stop aldactone now. Continue ARB and beta blocker.  3. Hypertension: Blood pressure is well controlled now.  4.   HLD excellent control with statin.   Follow up in 6 months.   Medication Adjustments/Labs and Tests Ordered: Current medicines are reviewed at length with the patient today.  Concerns regarding medicines are outlined above.  Medication changes, Labs and Tests ordered today are listed in the Patient Instructions below. Patient Instructions  Stop taking spironolactone  Continue your other therapy  Follow up in 6 months      Signed, Layken Doenges Martinique, MD  11/21/2018 4:06 PM    [facility] [place], [place], [place]  [address] Phone: [phone]; Fax: [phone]

## 2018-11-21 ENCOUNTER — Encounter: Payer: Self-pay | Admitting: Cardiology

## 2018-11-21 ENCOUNTER — Ambulatory Visit: Payer: Medicare HMO | Admitting: Cardiology

## 2018-11-21 ENCOUNTER — Other Ambulatory Visit: Payer: Self-pay

## 2018-11-21 VITALS — BP 143/67 | HR 73 | Temp 97.1°F | Ht 65.0 in | Wt 151.0 lb

## 2018-11-21 DIAGNOSIS — I2510 Atherosclerotic heart disease of native coronary artery without angina pectoris: Secondary | ICD-10-CM

## 2018-11-21 DIAGNOSIS — Z23 Encounter for immunization: Secondary | ICD-10-CM

## 2018-11-21 DIAGNOSIS — I10 Essential (primary) hypertension: Secondary | ICD-10-CM

## 2018-11-21 NOTE — Patient Instructions (Addendum)
Stop taking spironolactone  Continue your other therapy  Follow up in 6 months

## 2018-12-28 ENCOUNTER — Other Ambulatory Visit: Payer: Self-pay

## 2018-12-28 MED ORDER — ATORVASTATIN CALCIUM 40 MG PO TABS: 40.0000 mg | ORAL_TABLET | Freq: Every day | ORAL | 3 refills | Status: DC

## 2019-01-16 ENCOUNTER — Other Ambulatory Visit: Payer: Self-pay | Admitting: Cardiology

## 2019-02-10 DIAGNOSIS — Z1231 Encounter for screening mammogram for malignant neoplasm of breast: Secondary | ICD-10-CM | POA: Diagnosis not present

## 2019-02-10 DIAGNOSIS — Z124 Encounter for screening for malignant neoplasm of cervix: Secondary | ICD-10-CM | POA: Diagnosis not present

## 2019-02-10 DIAGNOSIS — Z6826 Body mass index (BMI) 26.0-26.9, adult: Secondary | ICD-10-CM | POA: Diagnosis not present

## 2019-02-14 DIAGNOSIS — N819 Female genital prolapse, unspecified: Secondary | ICD-10-CM | POA: Diagnosis not present

## 2019-03-16 DIAGNOSIS — N819 Female genital prolapse, unspecified: Secondary | ICD-10-CM | POA: Diagnosis not present

## 2019-04-20 ENCOUNTER — Ambulatory Visit: Payer: Medicare HMO | Attending: Internal Medicine

## 2019-04-20 DIAGNOSIS — Z23 Encounter for immunization: Secondary | ICD-10-CM

## 2019-04-20 NOTE — Progress Notes (Signed)
   Covid-19 Vaccination Clinic  Name:  Allison Sanders    MRN: [id number] DOB: 02-Feb-1951  04/20/2019  Allison Sanders was observed post Covid-19 immunization for 15 minutes without incidence. She was provided with Vaccine Information Sheet and instruction to access the V-Safe system.   Allison Sanders was instructed to call 911 with any severe reactions post vaccine: Marland Kitchen Difficulty breathing  . Swelling of your face and throat  . A fast heartbeat  . A bad rash all over your body  . Dizziness and weakness    Immunizations Administered    Name Date Dose VIS Date Route   Pfizer COVID-19 Vaccine 04/20/2019  8:25 AM 0.3 mL 02/03/2019 Intramuscular   Manufacturer: [place]   Lot: [id number]   [place]: [id number]

## 2019-05-10 ENCOUNTER — Ambulatory Visit: Payer: Medicare HMO | Attending: Internal Medicine

## 2019-05-10 DIAGNOSIS — Z23 Encounter for immunization: Secondary | ICD-10-CM

## 2019-05-10 NOTE — Progress Notes (Signed)
   Covid-19 Vaccination Clinic  Name:  Allison Sanders    MRN: [id number] DOB: Dec 02, 1950  05/10/2019  [name] was observed post Covid-19 immunization for 15 minutes without incident. She was provided with Vaccine Information Sheet and instruction to access the V-Safe system.   [name] was instructed to call 911 with any severe reactions post vaccine: Marland Kitchen Difficulty breathing  . Swelling of face and throat  . A fast heartbeat  . A bad rash all over body  . Dizziness and weakness   Immunizations Administered    Name Date Dose VIS Date Route   Pfizer COVID-19 Vaccine 05/10/2019 10:48 AM 0.3 mL 02/03/2019 Intramuscular   Manufacturer: [place]   Lot: [id number]   [place]: [id number]

## 2019-05-20 NOTE — Progress Notes (Signed)
Cardiology Office Note    Date:  05/25/2019   ID:  Allison Sanders, DOB 1950/12/15, MRN [id number]  PCP:  Seward Carol, MD  Cardiologist:  Dr. Martinique  Chief Complaint  Patient presents with  . Coronary Artery Disease    History of Present Illness:  Allison Sanders is a 69 y.o. female seen for follow up CAD. She presented in February 2019 with anterior STEMI.  Cardiac catheterization performed on 04/05/2017 showed 100% proximal LAD lesion treated with Synergy 3.0 x 20 mm DES, 100% ostial diagonal occlusion, 25% proximal to mid RCA, 30% proximal left circumflex lesion, EF was 50-55% by LV gram.  Follow-up echocardiogram obtained on 04/06/2017 showed EF 35-40%, akinesis of the mid apical anteroseptal, and apical myocardium, grade 2 DD.    Her serial troponin eventually trended up to 4.75.  Post-cath, patient was placed on aspirin, Lipitor, metoprolol and Brilinta.  In May 2019 Echo was repeated and showed normalization of LV function.  On follow up today she is doing well. On prior visit she noted  that sometimes she felt wiped out during the day. We discontinued aldactone and she immediately felt much better. She denies any chest pain or SOB. Not exercising as much. Did have the vaccine. She has gained some weight back and feels better at higher weight.   Past Medical History:  Diagnosis Date  . HTN (hypertension)   . Kidney stone     Past Surgical History:  Procedure Laterality Date  . CORONARY STENT INTERVENTION N/A 04/05/2017   Procedure: CORONARY STENT INTERVENTION;  Surgeon: Martinique, Kiyoto Slomski M, MD;  Location: [facility];  Service: Cardiovascular;  Laterality: N/A;  . CORONARY/GRAFT ACUTE MI REVASCULARIZATION N/A 04/05/2017   Procedure: Coronary/Graft Acute MI Revascularization;  Surgeon: Martinique, Willow Reczek M, MD;  Location: [facility];  Service: Cardiovascular;  Laterality: N/A;  . DILATION AND CURETTAGE, DIAGNOSTIC / THERAPEUTIC    . LEFT HEART CATH AND CORONARY  ANGIOGRAPHY N/A 04/05/2017   Procedure: LEFT HEART CATH AND CORONARY ANGIOGRAPHY;  Surgeon: Martinique, Aashvi Rezabek M, MD;  Location: [facility];  Service: Cardiovascular;  Laterality: N/A;  . left nephrectomy      Current Medications: Outpatient Medications Prior to Visit  Medication Sig Dispense Refill  . aspirin 81 MG chewable tablet Chew 1 tablet (81 mg total) by mouth daily. 30 tablet 2  . atorvastatin (LIPITOR) 40 MG tablet Take 1 tablet (40 mg total) by mouth daily. 90 tablet 3  . cholecalciferol (VITAMIN D) 1000 UNITS tablet Take 2,000 Units by mouth daily.    . irbesartan (AVAPRO) 75 MG tablet TAKE 1 TABLET BY MOUTH EVERY DAY 30 tablet 6  . metoprolol succinate (TOPROL-XL) 25 MG 24 hr tablet Take 1 tablet (25 mg total) by mouth at bedtime. 30 tablet 11  . vitamin C (ASCORBIC ACID) 250 MG tablet Take 250 mg by mouth daily.    . nitroGLYCERIN (NITROSTAT) 0.4 MG SL tablet Place 1 tablet (0.4 mg total) under the tongue every 5 (five) minutes x 3 doses as needed for chest pain. 25 tablet 11  . spironolactone (ALDACTONE) 25 MG tablet TAKE 1/2 TABLET BY MOUTH DAILY 45 tablet 3   No facility-administered medications prior to visit.     Allergies:   Penicillins, Sulfa antibiotics, and Contrast media [iodinated diagnostic agents]   Social History   Socioeconomic History  . Marital status: Single    Spouse name: Not on file  . Number of children: 2  .  Years of education: Not on file  . Highest education level: Not on file  Occupational History  . Not on file  Tobacco Use  . Smoking status: Never Smoker  . Smokeless tobacco: Never Used  Substance and Sexual Activity  . Alcohol use: No    Alcohol/week: 0.0 standard drinks  . Drug use: No  . Sexual activity: Not on file  Other Topics Concern  . Not on file  Social History Narrative  . Not on file   Social Determinants of Health   Financial Resource Strain:   . Difficulty of Paying Living Expenses:   Food Insecurity:   .  Worried About Charity fundraiser in the Last Year:   . Arboriculturist in the Last Year:   Transportation Needs:   . Film/video editor (Medical):   Marland Kitchen Lack of Transportation (Non-Medical):   Physical Activity:   . Days of Exercise per Week:   . Minutes of Exercise per Session:   Stress:   . Feeling of Stress :   Social Connections:   . Frequency of Communication with Friends and Family:   . Frequency of Social Gatherings with Friends and Family:   . Attends Religious Services:   . Active Member of Clubs or Organizations:   . Attends Archivist Meetings:   Marland Kitchen Marital Status:      Family History:  The patient's family history includes Heart attack in her father.   ROS:   Please see the history of present illness.    ROS All other systems reviewed and are negative.   PHYSICAL EXAM:   VS:  BP (!) 142/66 (BP Location: Right Arm)   Pulse 61   Ht 5\' 5"  (1.651 m)   Wt 161 lb 3.2 oz (73.1 kg)   BMI 26.83 kg/m    GENERAL:  Well appearing WF in NAD HEENT:  PERRL, EOMI, sclera are clear. Oropharynx is clear. NECK:  No jugular venous distention, carotid upstroke brisk and symmetric, no bruits, no thyromegaly or adenopathy LUNGS:  Clear to auscultation bilaterally CHEST:  Unremarkable HEART:  RRR,  PMI not displaced or sustained,S1 and S2 within normal limits, no S3, no S4: no clicks, no rubs, no murmurs ABD:  Soft, nontender. BS +, no masses or bruits. No hepatomegaly, no splenomegaly EXT:  2 + pulses throughout, no edema, no cyanosis no clubbing SKIN:  Warm and dry.  No rashes NEURO:  Alert and oriented x 3. Cranial nerves II through XII intact. PSYCH:  Cognitively intact      Wt Readings from Last 3 Encounters:  05/25/19 161 lb 3.2 oz (73.1 kg)  11/21/18 151 lb (68.5 kg)  04/25/18 147 lb (66.7 kg)      Studies/Labs Reviewed:   EKG:  EKG is  ordered today. NSR rate 61, normal Ecg. I have personally reviewed and interpreted this study.    Recent  Labs: No results found for requested labs within last 8760 hours.   Lipid Panel    Component Value Date/Time   CHOL 93 (L) 04/20/2018 0901   TRIG 58 04/20/2018 0901   HDL 49 04/20/2018 0901   CHOLHDL 2.0 06/07/2017 0908   CHOLHDL 3.3 04/05/2017 2044   VLDL 25 04/05/2017 2044   LDLCALC 32 04/20/2018 0901    Additional studies/ records that were reviewed today include:   Cath 04/05/2017 Conclusion     Prox LAD lesion is 100% stenosed.  A drug-eluting stent was successfully placed using a STENT  SYNERGY DES 3X20.  Post intervention, there is a 0% residual stenosis.  Ost 1st Diag lesion is 100% stenosed.  Prox RCA to Mid RCA lesion is 25% stenosed.  Prox Cx lesion is 30% stenosed.  The left ventricular systolic function is normal.  LV end diastolic pressure is normal.  The left ventricular ejection fraction is 50-55% by visual estimate.   1. Single vessel occlusive CAD 2. Mild LV dysfunction with apical wall motion abnormality. 3. Normal LVEDP 4. Successful PCI and stenting of the mid LAD with DES  Plan: DAPT for one year. Risk factor modification.     Echo 04/06/2017 LV EF: 35% -   40%  ------------------------------------------------------------------- Indications:      CAD of native vessels 414.01.  ------------------------------------------------------------------- History:   Risk factors:  Hypertension.  ------------------------------------------------------------------- Study Conclusions  - Left ventricle: The cavity size was normal. Wall thickness was   normal. Systolic function was moderately reduced. The estimated   ejection fraction was in the range of 35% to 40%. Akinesis of the   mid-apicalanteroseptal and apical myocardium. Features are   consistent with a pseudonormal left ventricular filling pattern,   with concomitant abnormal relaxation and increased filling   pressure (grade 2 diastolic dysfunction).  Echo 07/06/17: Study  Conclusions  - Left ventricle: The cavity size was normal. Systolic function was   normal. The estimated ejection fraction was in the range of 55%   to 60%. Wall motion was normal; there were no regional wall   motion abnormalities. Left ventricular diastolic function   parameters were normal. - Aortic valve: Trileaflet; mildly thickened, mildly calcified   leaflets. - Mitral valve: Calcified annulus. Mild thickening and   calcification of the anterior leaflet. There was trivial   regurgitation. - Atrial septum: There was increased thickness of the septum,   consistent with lipomatous hypertrophy. - Pulmonic valve: There was trivial regurgitation.  Impressions:  - Compared to prior echo, LVF has normalized.  ASSESSMENT:    1. Coronary artery disease involving native coronary artery of native heart without angina pectoris   2. Essential hypertension   3. Hypercholesteremia      PLAN:  In order of problems listed above:  1. CAD: s/p  anterior STEMI with emergent DES of LAD 04/05/17.  Continue aspirin,  Lipitor, beta-blocker. Continue lifestyle modification  2. Ischemic cardiomyopathy: EF 35-40%. Repeat Echo in May 2020 showed normal LV function.  Continue ARB and beta blocker.  3. Hypertension: Blood pressure is fairly  well controlled now.   4.   HLD on statin. Will check fasting labs today.  Follow up in one year.   Medication Adjustments/Labs and Tests Ordered: Current medicines are reviewed at length with the patient today.  Concerns regarding medicines are outlined above.  Medication changes, Labs and Tests ordered today are listed in the Patient Instructions below. There are no Patient Instructions on file for this visit.   Signed, Cordie Beazley Martinique, MD  05/25/2019 9:58 AM    [facility] [place], [place], [place]  [address] Phone: [phone]; Fax: [phone]

## 2019-05-25 ENCOUNTER — Ambulatory Visit: Payer: Medicare HMO | Admitting: Cardiology

## 2019-05-25 ENCOUNTER — Encounter: Payer: Self-pay | Admitting: Cardiology

## 2019-05-25 ENCOUNTER — Other Ambulatory Visit: Payer: Self-pay

## 2019-05-25 VITALS — BP 142/66 | HR 61 | Ht 65.0 in | Wt 161.2 lb

## 2019-05-25 DIAGNOSIS — I2510 Atherosclerotic heart disease of native coronary artery without angina pectoris: Secondary | ICD-10-CM

## 2019-05-25 DIAGNOSIS — E7800 Pure hypercholesterolemia, unspecified: Secondary | ICD-10-CM

## 2019-05-25 DIAGNOSIS — I10 Essential (primary) hypertension: Secondary | ICD-10-CM | POA: Diagnosis not present

## 2019-05-25 LAB — LIPID PANEL
Chol/HDL Ratio: 1.9 ratio (ref 0.0–4.4)
Cholesterol, Total: 98 mg/dL — ABNORMAL LOW (ref 100–199)
HDL: 52 mg/dL (ref >39)
LDL Chol Calc (NIH): 28 mg/dL (ref 0–99)
Triglycerides: 91 mg/dL (ref 0–149)
VLDL Cholesterol Cal: 18 mg/dL (ref 5–40)

## 2019-05-25 LAB — BASIC METABOLIC PANEL
BUN/Creatinine Ratio: 21 (ref 12–28)
BUN: 19 mg/dL (ref 8–27)
CO2: 25 mmol/L (ref 20–29)
Calcium: 9.9 mg/dL (ref 8.7–10.3)
Chloride: 103 mmol/L (ref 96–106)
Creatinine, Ser: 0.91 mg/dL (ref 0.57–1.00)
GFR calc Af Amer: 74 mL/min/{1.73_m2} (ref >59)
GFR calc non Af Amer: 65 mL/min/{1.73_m2} (ref >59)
Glucose: 90 mg/dL (ref 65–99)
Potassium: 4.8 mmol/L (ref 3.5–5.2)
Sodium: 142 mmol/L (ref 134–144)

## 2019-05-25 LAB — HEPATIC FUNCTION PANEL
ALT: 26 IU/L (ref 0–32)
AST: 22 IU/L (ref 0–40)
Albumin: 4.2 g/dL (ref 3.8–4.8)
Alkaline Phosphatase: 96 IU/L (ref 39–117)
Bilirubin Total: 0.7 mg/dL (ref 0.0–1.2)
Bilirubin, Direct: 0.21 mg/dL (ref 0.00–0.40)
Total Protein: 6.9 g/dL (ref 6.0–8.5)

## 2019-05-25 MED ORDER — NITROGLYCERIN 0.4 MG SL SUBL: 0.4000 mg | SUBLINGUAL_TABLET | SUBLINGUAL | 11 refills | Status: DC | PRN

## 2019-06-17 ENCOUNTER — Other Ambulatory Visit: Payer: Self-pay | Admitting: Cardiology

## 2019-06-19 DIAGNOSIS — N819 Female genital prolapse, unspecified: Secondary | ICD-10-CM | POA: Diagnosis not present

## 2019-07-31 IMAGING — CT CT RENAL STONE PROTOCOL
2 of 4 series · 16 of 46 positions shown, 18 images · non-contrast
Comparison: None

CLINICAL DATA: LEFT flank pain for 3 days, hematuria, on
anticoagulant therapy, history of kidney stones and hypertension

EXAM:
CT ABDOMEN AND PELVIS WITHOUT CONTRAST
TECHNIQUE: Multidetector CT imaging of the abdomen and pelvis was performed
following the standard protocol without IV contrast. Sagittal and
coronal MPR images reconstructed from axial data set. Oral contrast
was not administered.

[Series 3: renal stone 5.0 · axial · 0.72mm/px · z∈[+794,+1199]mm · 13 of 89 slices shown, 15 images]
[im 4/89  soft-tissue]
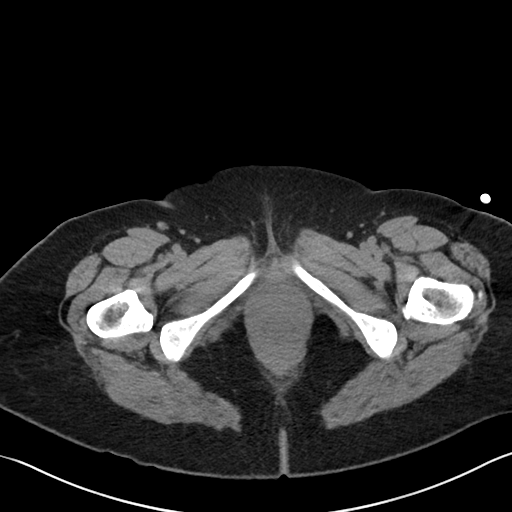
[im 4/89  bone]
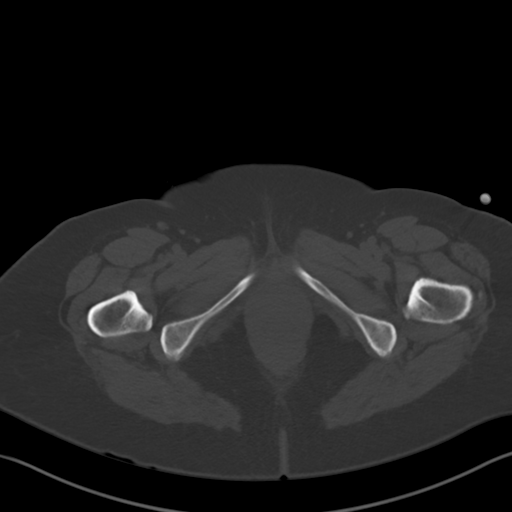
[im 11/89  soft-tissue]
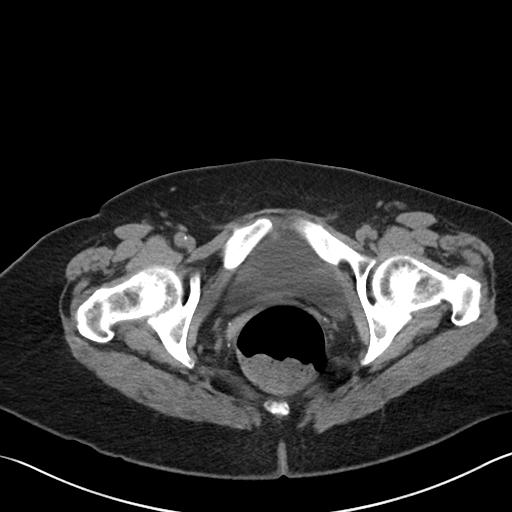
[im 18/89  soft-tissue]
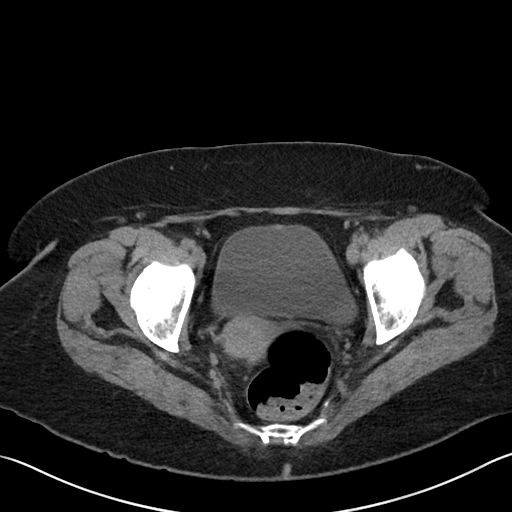
[im 25/89  soft-tissue]
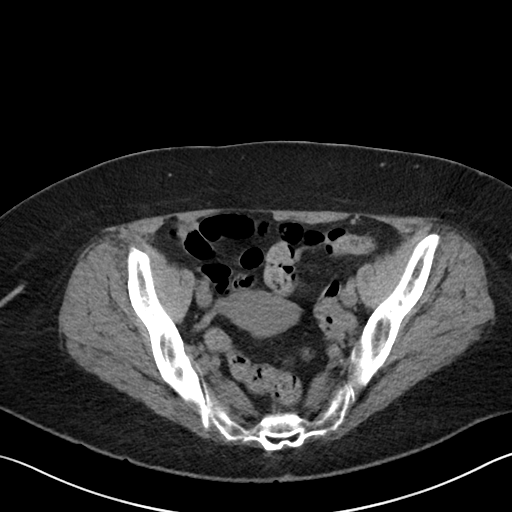
[im 32/89  soft-tissue]
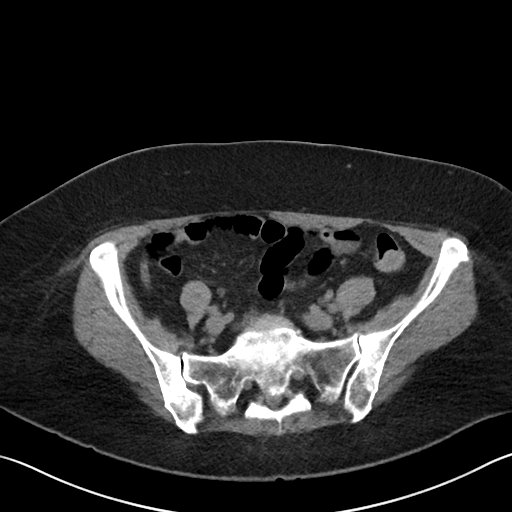
[im 39/89  soft-tissue]
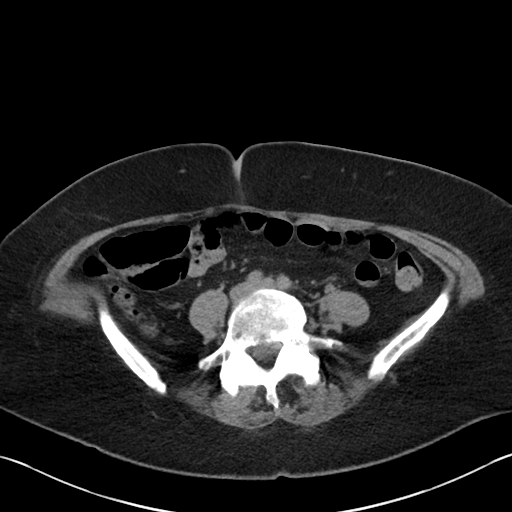
[im 46/89  soft-tissue]
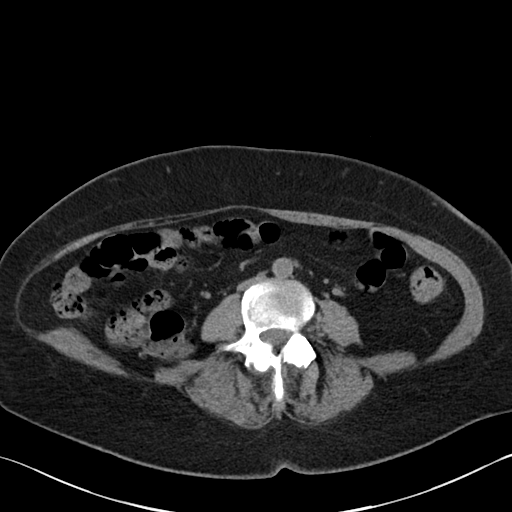
[im 50/89  soft-tissue]
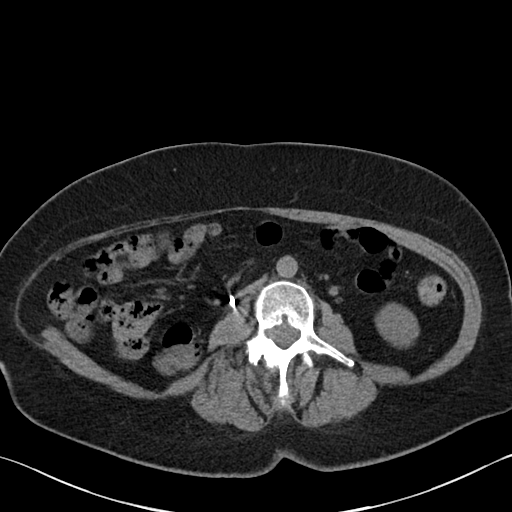
[im 57/89  soft-tissue]
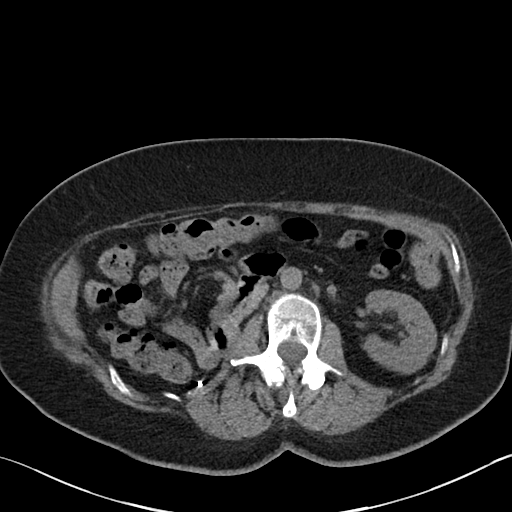
[im 57/89  bone]
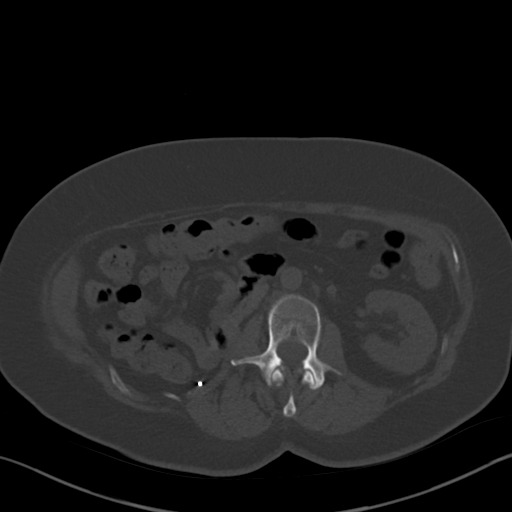
[im 64/89  soft-tissue]
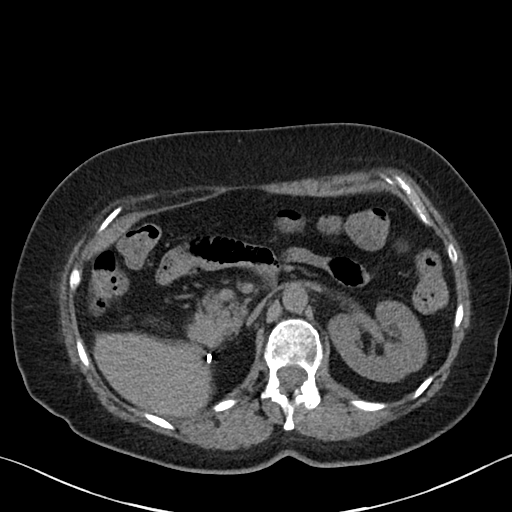
[im 71/89  soft-tissue]
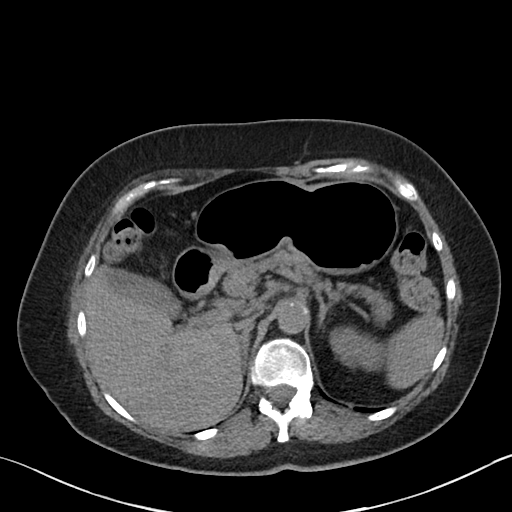
[im 78/89  soft-tissue]
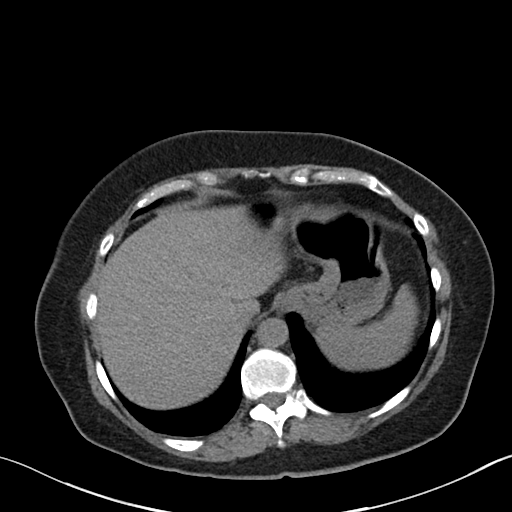
[im 85/89  soft-tissue]
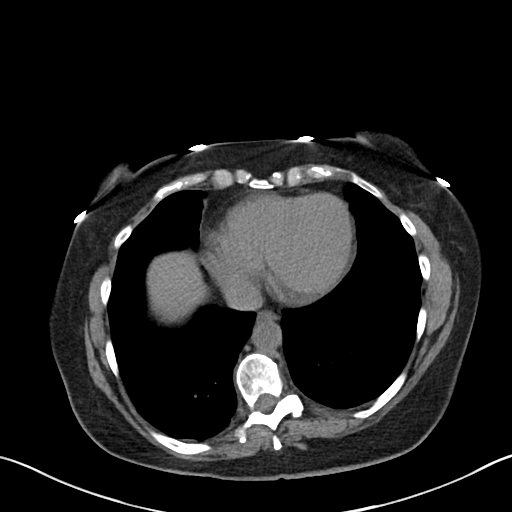

[Series 5: renal stone 3.0 cor · coronal · 0.64mm/px · 3 of 100 slices shown]
[im 34/100  soft-tissue]
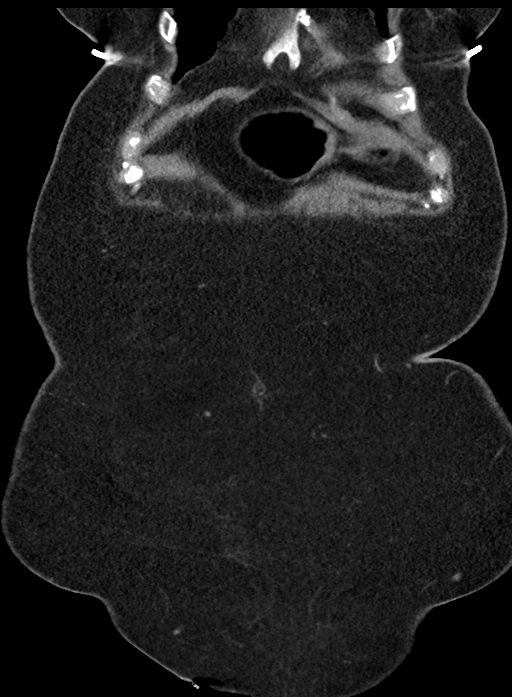
[im 45/100  soft-tissue]
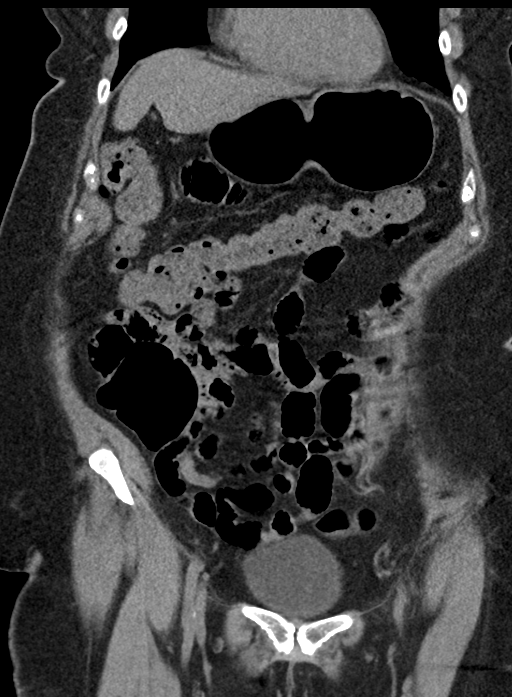
[im 56/100  soft-tissue]
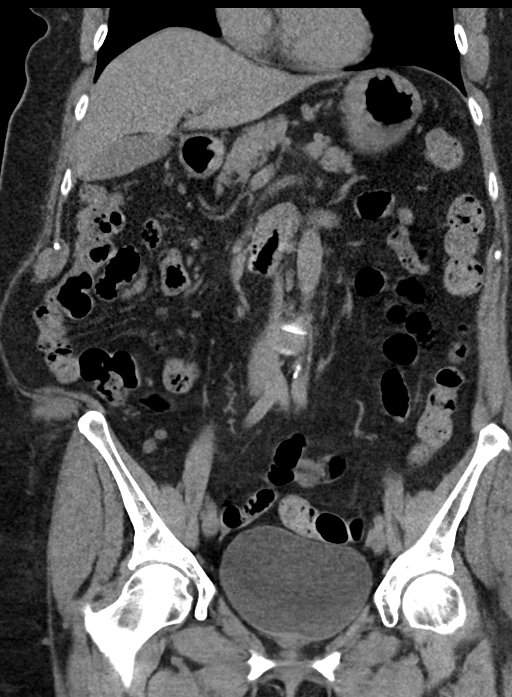

[16 of 46 positions shown; findings below may reference images not displayed]

FINDINGS: Lower chest: Lung bases clear

Hepatobiliary: Gallbladder and liver normal appearance

Pancreas: Pancreatic atrophy without mass

Spleen: Single calcified granuloma in spleen. Spleen otherwise
unremarkable.

Adrenals/Urinary Tract: Adrenal glands normal appearance. Prior
RIGHT nephrectomy. Nonobstructing LEFT renal calculi up to 4 mm
diameter. No LEFT hydronephrosis or hydroureter. Bladder
unremarkable. Bladder unremarkable.

Stomach/Bowel: Normal appendix. Stomach and bowel loops normal
appearance.

Vascular/Lymphatic: Atherosclerotic calcifications aorta without
aneurysm. No adenopathy.

Reproductive: Atrophic uterus.  Unremarkable adnexa.

Other: No free air or free fluid. No acute inflammatory process.
Atrophic RIGHT anterior and lateral abdominal wall musculature.

Musculoskeletal: No acute osseous findings.
IMPRESSION: Nonobstructing LEFT renal calculi.

Prior RIGHT nephrectomy.

No acute intra-abdominal or intrapelvic abnormalities.

## 2019-08-15 ENCOUNTER — Other Ambulatory Visit: Payer: Self-pay | Admitting: Cardiology

## 2019-12-05 ENCOUNTER — Ambulatory Visit (INDEPENDENT_AMBULATORY_CARE_PROVIDER_SITE_OTHER): Payer: Medicare HMO

## 2019-12-05 ENCOUNTER — Other Ambulatory Visit: Payer: Self-pay

## 2019-12-05 ENCOUNTER — Ambulatory Visit: Payer: Medicare HMO | Admitting: Family Medicine

## 2019-12-05 ENCOUNTER — Encounter: Payer: Self-pay | Admitting: Family Medicine

## 2019-12-05 VITALS — BP 122/88 | HR 58 | Ht 65.0 in | Wt 167.0 lb

## 2019-12-05 DIAGNOSIS — M545 Low back pain, unspecified: Secondary | ICD-10-CM | POA: Diagnosis not present

## 2019-12-05 DIAGNOSIS — M5136 Other intervertebral disc degeneration, lumbar region: Secondary | ICD-10-CM

## 2019-12-05 DIAGNOSIS — M25552 Pain in left hip: Secondary | ICD-10-CM

## 2019-12-05 DIAGNOSIS — M47816 Spondylosis without myelopathy or radiculopathy, lumbar region: Secondary | ICD-10-CM | POA: Diagnosis not present

## 2019-12-05 DIAGNOSIS — M7062 Trochanteric bursitis, left hip: Secondary | ICD-10-CM

## 2019-12-05 NOTE — Assessment & Plan Note (Signed)
Patient given injection today and tolerated the procedure well.  Patient did have a significant amount of decreased pain.  Avoid anti-inflammatories secondary to patient's cardiac history.  Discussed possibly some mild topical anti-inflammatories or more natural plant-based.  Discussed home exercise and patient work with Product/process development scientist.  Differential does include patient's degenerative disc disease and some lumbar radiculopathy and we will need to monitor closely.  X-rays of the hip ordered today as well secondary to some of the mild groin pain patient has had.  Follow-up with me again 4 to 6 weeks

## 2019-12-05 NOTE — Progress Notes (Signed)
Allison Sanders Phone: [phone] Subjective:   Allison Sanders, am serving as a scribe for Dr. Hulan Saas. This visit occurred during the SARS-CoV-2 public health emergency.  Safety protocols were in place, including screening questions prior to the visit, additional usage of staff PPE, and extensive cleaning of exam room while observing appropriate contact time as indicated for disinfecting solutions.   I'm seeing this patient by the request  of:  Allison Carol, MD  CC: back pain   [id number]  Allison Sanders is a 69 y.o. female coming in with complaint of left hip pain. Last seen in 2018 for elbow pain. Patient states that she started exercising again a month ago. Did some crunches and leg lifts and her pain increased in left side. Feels it is her sciatic nerve as she has had trouble with it before. Pain is now radiating into left groin. Was unable to walk up stairs leading with the left. Is unable to get comfortable at night. Patient was taking turmeric and tart cherry extract. Patient had a heart attack and wants to know if she can take these supplements.    X-rays from 2017 were independently visualized by me.  X-rays of the lumbar spine showed scoliosis with degenerative disc and facet arthropathy  Past Medical History:  Diagnosis Date  . HTN (hypertension)   . Kidney stone    Past Surgical History:  Procedure Laterality Date  . CORONARY STENT INTERVENTION N/A 04/05/2017   Procedure: CORONARY STENT INTERVENTION;  Surgeon: Martinique, Peter M, MD;  Location: [facility];  Service: Cardiovascular;  Laterality: N/A;  . CORONARY/GRAFT ACUTE MI REVASCULARIZATION N/A 04/05/2017   Procedure: Coronary/Graft Acute MI Revascularization;  Surgeon: Martinique, Peter M, MD;  Location: [facility];  Service: Cardiovascular;  Laterality: N/A;  . DILATION AND CURETTAGE, DIAGNOSTIC / THERAPEUTIC    . LEFT HEART CATH  AND CORONARY ANGIOGRAPHY N/A 04/05/2017   Procedure: LEFT HEART CATH AND CORONARY ANGIOGRAPHY;  Surgeon: Martinique, Peter M, MD;  Location: [facility];  Service: Cardiovascular;  Laterality: N/A;  . left nephrectomy     Social History   Socioeconomic History  . Marital status: Single    Spouse name: Not on file  . Number of children: 2  . Years of education: Not on file  . Highest education level: Not on file  Occupational History  . Not on file  Tobacco Use  . Smoking status: Never Smoker  . Smokeless tobacco: Never Used  Vaping Use  . Vaping Use: Never used  Substance and Sexual Activity  . Alcohol use: Sanders    Alcohol/week: 0.0 standard drinks  . Drug use: Sanders  . Sexual activity: Not on file  Other Topics Concern  . Not on file  Social History Narrative  . Not on file   Social Determinants of Health   Financial Resource Strain:   . Difficulty of Paying Living Expenses: Not on file  Food Insecurity:   . Worried About Charity fundraiser in the Last Year: Not on file  . Ran Out of Food in the Last Year: Not on file  Transportation Needs:   . Lack of Transportation (Medical): Not on file  . Lack of Transportation (Non-Medical): Not on file  Physical Activity:   . Days of Exercise per Week: Not on file  . Minutes of Exercise per Session: Not on file  Stress:   . Feeling  of Stress : Not on file  Social Connections:   . Frequency of Communication with Friends and Family: Not on file  . Frequency of Social Gatherings with Friends and Family: Not on file  . Attends Religious Services: Not on file  . Active Member of Clubs or Organizations: Not on file  . Attends Archivist Meetings: Not on file  . Marital Status: Not on file   Allergies  Allergen Reactions  . Penicillins Other (See Comments)    Childhood allergy, reaction unknown  . Sulfa Antibiotics Other (See Comments)    Childhood allergy, reaction unknown  . Contrast Media [Iodinated Diagnostic  Agents] Rash    Pt can tolerate with Diphenhydramine and Steroids   Family History  Problem Relation Age of Onset  . Heart attack Father      Current Outpatient Medications (Cardiovascular):  .  atorvastatin (LIPITOR) 40 MG tablet, Take 1 tablet (40 mg total) by mouth daily. .  irbesartan (AVAPRO) 75 MG tablet, TAKE 1 TABLET BY MOUTH EVERY DAY .  metoprolol succinate (TOPROL-XL) 25 MG 24 hr tablet, Take 1 tablet (25 mg total) by mouth daily. .  nitroGLYCERIN (NITROSTAT) 0.4 MG SL tablet, Place 1 tablet (0.4 mg total) under the tongue every 5 (five) minutes x 3 doses as needed for chest pain.   Current Outpatient Medications (Analgesics):  .  aspirin 81 MG chewable tablet, Chew 1 tablet (81 mg total) by mouth daily.   Current Outpatient Medications (Other):  .  cholecalciferol (VITAMIN D) 1000 UNITS tablet, Take 2,000 Units by mouth daily. .  vitamin C (ASCORBIC ACID) 250 MG tablet, Take 250 mg by mouth daily.   Reviewed prior external information including notes and imaging from  primary care provider As well as notes that were available from care everywhere and other healthcare systems.  Past medical history, social, surgical and family history all reviewed in electronic medical record.  Sanders pertanent information unless stated regarding to the chief complaint.   Review of Systems:  Sanders headache, visual changes, nausea, vomiting, diarrhea, constipation, dizziness, abdominal pain, skin rash, fevers, chills, night sweats, weight loss, swollen lymph nodes, body aches, joint swelling, chest pain, shortness of breath, mood changes. POSITIVE muscle aches  Objective  Blood pressure 122/88, pulse (!) 58, height 5\' 5"  (1.651 m), weight 167 lb (75.8 kg), SpO2 99 %.   General: Sanders apparent distress alert and oriented x3 mood and affect normal, dressed appropriately.  HEENT: Pupils equal, extraocular movements intact  Respiratory: Patient's speak in full sentences and does not appear short of  breath  Cardiovascular: Sanders lower extremity edema, non tender, Sanders erythema  Neuro: Cranial nerves II through XII are intact, neurovascularly intact in all extremities with 2+ DTRs and 2+ pulses.  Gait mild antalgic MSK:   Patient has tenderness to palpation over the greater trochanteric area on the left side.  Patient has known good range of motion with internal range of motion.  Patient has worsening pain with Corky Sox with pain on the lateral aspect of the hip more.  Patient does have some mild abnormal tightness of the paraspinal musculature lumbar spine but Sanders significant pain.  Some degenerative scoliosis also noted.  After verbal consent patient was prepped with alcohol swabs and with a 21-gauge 2 inch needle injected into the left greater trochanteric area with a total of 1 cc of 0.5% Marcaine and 1 cc of Kenalog 40 mg/mL.  Sanders blood loss.  Postinjection instructions given     Impression  and Recommendations:     The above documentation has been reviewed and is accurate and complete Lyndal Pulley, DO

## 2019-12-05 NOTE — Patient Instructions (Signed)
Exercises GT Xray hip and back Injection today Ice 20 min 2x a day Stop turmeric Ok to walk See me again in 5-6 weeks

## 2019-12-20 DIAGNOSIS — N819 Female genital prolapse, unspecified: Secondary | ICD-10-CM | POA: Diagnosis not present

## 2020-01-11 ENCOUNTER — Ambulatory Visit: Payer: Medicare HMO | Admitting: Family Medicine

## 2020-01-11 ENCOUNTER — Other Ambulatory Visit: Payer: Self-pay

## 2020-01-11 ENCOUNTER — Encounter: Payer: Self-pay | Admitting: Family Medicine

## 2020-01-11 DIAGNOSIS — M25552 Pain in left hip: Secondary | ICD-10-CM

## 2020-01-11 NOTE — Patient Instructions (Signed)
Keep doing what you are doing Vitamin D 3,000 IUs daily Consider avoiding long walks if possible Avoid excessive back extension If worse call me we will order MRI of left hip See me again in 5-6 weeks

## 2020-01-11 NOTE — Progress Notes (Signed)
[place] [address] [place] Phone: [phone] Subjective:   I Allison Sanders am serving as a Education administrator for Dr. Hulan Saas.  This visit occurred during the SARS-CoV-2 public health emergency.  Safety protocols were in place, including screening questions prior to the visit, additional usage of staff PPE, and extensive cleaning of exam room while observing appropriate contact time as indicated for disinfecting solutions.   I'm seeing this patient by the request  of:  Seward Carol, MD  CC: Left hip pain follow-up  [id number]   12/05/2019 Patient given injection today and tolerated the procedure well.  Patient did have a significant amount of decreased pain.  Avoid anti-inflammatories secondary to patient's cardiac history.  Discussed possibly some mild topical anti-inflammatories or more natural plant-based.  Discussed home exercise and patient work with Product/process development scientist.  Differential does include patient's degenerative disc disease and some lumbar radiculopathy and we will need to monitor closely.  X-rays of the hip ordered today as well secondary to some of the mild groin pain patient has had.  Follow-up with me again 4 to 6 weeks  Update 01/11/2020 Allison Sanders is a 69 y.o. female coming in with complaint of left hip pain. Patient states she is still having issues. A little better than it was. States she is walking better. Issues with stairs. Not as bad but still having issues. States she tries to go up the stairs like she is supposed to but this causes pain.  Patient states that the injection on the side of the hip did help the pain at night on the lateral aspect.  Still having no pain with trying to put excessive weight or going up the stairs.  Does continue to radiate to the groin when she does do this.  Has been able to walk around the neighborhood of approximately 1 mile at a time with no pain but if she does some of the  exercises significantly the pain seems to worsen.   Patient did have x-rays at last exam.  X-rays show the patient has severe degenerative changes of the lumbar spine with moderate degenerative changes of the left hip.    Past Medical History:  Diagnosis Date  . HTN (hypertension)   . Kidney stone    Past Surgical History:  Procedure Laterality Date  . CORONARY STENT INTERVENTION N/A 04/05/2017   Procedure: CORONARY STENT INTERVENTION;  Surgeon: Martinique, Peter M, MD;  Location: [facility];  Service: Cardiovascular;  Laterality: N/A;  . CORONARY/GRAFT ACUTE MI REVASCULARIZATION N/A 04/05/2017   Procedure: Coronary/Graft Acute MI Revascularization;  Surgeon: Martinique, Peter M, MD;  Location: [facility];  Service: Cardiovascular;  Laterality: N/A;  . DILATION AND CURETTAGE, DIAGNOSTIC / THERAPEUTIC    . LEFT HEART CATH AND CORONARY ANGIOGRAPHY N/A 04/05/2017   Procedure: LEFT HEART CATH AND CORONARY ANGIOGRAPHY;  Surgeon: Martinique, Peter M, MD;  Location: [facility];  Service: Cardiovascular;  Laterality: N/A;  . left nephrectomy     Social History   Socioeconomic History  . Marital status: Single    Spouse name: Not on file  . Number of children: 2  . Years of education: Not on file  . Highest education level: Not on file  Occupational History  . Not on file  Tobacco Use  . Smoking status: Never Smoker  . Smokeless tobacco: Never Used  Vaping Use  . Vaping Use: Never used  Substance and Sexual Activity  .  Alcohol use: No    Alcohol/week: 0.0 standard drinks  . Drug use: No  . Sexual activity: Not on file  Other Topics Concern  . Not on file  Social History Narrative  . Not on file   Social Determinants of Health   Financial Resource Strain:   . Difficulty of Paying Living Expenses: Not on file  Food Insecurity:   . Worried About Charity fundraiser in the Last Year: Not on file  . Ran Out of Food in the Last Year: Not on file  Transportation Needs:    . Lack of Transportation (Medical): Not on file  . Lack of Transportation (Non-Medical): Not on file  Physical Activity:   . Days of Exercise per Week: Not on file  . Minutes of Exercise per Session: Not on file  Stress:   . Feeling of Stress : Not on file  Social Connections:   . Frequency of Communication with Friends and Family: Not on file  . Frequency of Social Gatherings with Friends and Family: Not on file  . Attends Religious Services: Not on file  . Active Member of Clubs or Organizations: Not on file  . Attends Archivist Meetings: Not on file  . Marital Status: Not on file   Allergies  Allergen Reactions  . Penicillins Other (See Comments)    Childhood allergy, reaction unknown  . Sulfa Antibiotics Other (See Comments)    Childhood allergy, reaction unknown  . Contrast Media [Iodinated Diagnostic Agents] Rash    Pt can tolerate with Diphenhydramine and Steroids   Family History  Problem Relation Age of Onset  . Heart attack Father      Current Outpatient Medications (Cardiovascular):  .  atorvastatin (LIPITOR) 40 MG tablet, Take 1 tablet (40 mg total) by mouth daily. .  irbesartan (AVAPRO) 75 MG tablet, TAKE 1 TABLET BY MOUTH EVERY DAY .  metoprolol succinate (TOPROL-XL) 25 MG 24 hr tablet, Take 1 tablet (25 mg total) by mouth daily. .  nitroGLYCERIN (NITROSTAT) 0.4 MG SL tablet, Place 1 tablet (0.4 mg total) under the tongue every 5 (five) minutes x 3 doses as needed for chest pain.   Current Outpatient Medications (Analgesics):  .  aspirin 81 MG chewable tablet, Chew 1 tablet (81 mg total) by mouth daily.   Current Outpatient Medications (Other):  .  cholecalciferol (VITAMIN D) 1000 UNITS tablet, Take 2,000 Units by mouth daily. .  vitamin C (ASCORBIC ACID) 250 MG tablet, Take 250 mg by mouth daily.   Reviewed prior external information including notes and imaging from  primary care provider As well as notes that were available from care  everywhere and other healthcare systems.  Past medical history, social, surgical and family history all reviewed in electronic medical record.  No pertanent information unless stated regarding to the chief complaint.   Review of Systems:  No headache, visual changes, nausea, vomiting, diarrhea, constipation, dizziness, abdominal pain, skin rash, fevers, chills, night sweats, weight loss, swollen lymph nodes, body aches, joint swelling, chest pain, shortness of breath, mood changes. POSITIVE muscle aches  Objective  Blood pressure 140/86, pulse (!) 56, height 5\' 5"  (1.651 m), weight 165 lb (74.8 kg), SpO2 97 %.   General: No apparent distress alert and oriented x3 mood and affect normal, dressed appropriately.  HEENT: Pupils equal, extraocular movements intact  Respiratory: Patient's speak in full sentences and does not appear short of breath  Cardiovascular: No lower extremity edema, non tender, no erythema  Improvement  in gait at this time. Left hip exam still has some mild discomfort on the lateral aspect of the hip but significantly better.  Patient has no pain with internal range of motion but does have some mild positive fulcrum minorly noted.  Negative straight leg test.  Does have significant loss of lordosis of the back.  Patient does have some increased fatty deposits of this hip but is symmetric to the contralateral side.    Impression and Recommendations:     The above documentation has been reviewed and is accurate and complete Lyndal Pulley, DO  Total time with patient reviewing patient's imaging with her, different treatment options, greater than 31 minutes

## 2020-01-11 NOTE — Assessment & Plan Note (Signed)
Patient did respond fairly well to the greater trochanteric injection but continued to have pain.  Difficult to assess.  Patient may have more of a gluteal tendinitis but then with the radiation and seems to go to the groin concern for potential stress reaction.  Patient declined an MRI though which was our other possibility with patient having a positive fulcrum.  Patient feels that with her not making some improvement she would like to continue with the conservative therapy.  We discussed the possibility of formal physical therapy but feels like she is making some progress.  Follow-up again in 4 weeks otherwise.

## 2020-01-15 ENCOUNTER — Other Ambulatory Visit: Payer: Self-pay | Admitting: Cardiology

## 2020-02-09 ENCOUNTER — Telehealth: Payer: Self-pay | Admitting: Family Medicine

## 2020-02-09 NOTE — Telephone Encounter (Signed)
Phone call worsening pain for patient MRI of left hip please order

## 2020-02-09 NOTE — Telephone Encounter (Signed)
Patient called stating that she would like to go ahead and have the MRI done that Dr Tamala Julian discussed.  Can this be ordered for her?

## 2020-02-12 ENCOUNTER — Other Ambulatory Visit: Payer: Self-pay

## 2020-02-12 DIAGNOSIS — M7062 Trochanteric bursitis, left hip: Secondary | ICD-10-CM

## 2020-02-12 NOTE — Telephone Encounter (Signed)
It looked like we ordered the MRI.  It is really up to her.

## 2020-02-13 NOTE — Telephone Encounter (Signed)
Spoke with patient and provided her with [place] phone number.

## 2020-02-14 ENCOUNTER — Ambulatory Visit: Payer: Medicare HMO | Admitting: Family Medicine

## 2020-03-07 ENCOUNTER — Telehealth: Payer: Self-pay | Admitting: Cardiology

## 2020-03-07 NOTE — Telephone Encounter (Signed)
Patient called and wanted to verify that it was ok for her to have hip surgery. Told patient that doctor's office have to fax [place] a medical clearance form or call us to get medical clearance. Patient wanted to know why she wasn't informed about this first. Please call patient to verify.

## 2020-03-07 NOTE — Telephone Encounter (Signed)
Spoke with pt about her having an MRI to better evaluate her hip and hip pain per her orthopedic doctor.  Pt was concerned about having MRI because of her having a coronary stent.  Per Dr. Martinique ok to have MRI.  Pt verbalizes understanding and is thankful for the call.

## 2020-03-12 ENCOUNTER — Other Ambulatory Visit: Payer: Medicare HMO

## 2020-03-13 ENCOUNTER — Other Ambulatory Visit: Payer: Self-pay | Admitting: Cardiology

## 2020-03-25 ENCOUNTER — Ambulatory Visit
Admission: RE | Admit: 2020-03-25 | Discharge: 2020-03-25 | Disposition: A | Discharge status: Home / Self Care | Payer: Medicare HMO | Source: Ambulatory Visit | Attending: Family Medicine | Admitting: Family Medicine

## 2020-03-25 DIAGNOSIS — S73192A Other sprain of left hip, initial encounter: Secondary | ICD-10-CM | POA: Diagnosis not present

## 2020-03-25 DIAGNOSIS — M533 Sacrococcygeal disorders, not elsewhere classified: Secondary | ICD-10-CM | POA: Diagnosis not present

## 2020-03-25 DIAGNOSIS — M1612 Unilateral primary osteoarthritis, left hip: Secondary | ICD-10-CM | POA: Diagnosis not present

## 2020-03-25 DIAGNOSIS — M7062 Trochanteric bursitis, left hip: Secondary | ICD-10-CM

## 2020-03-26 ENCOUNTER — Telehealth: Payer: Self-pay

## 2020-03-26 NOTE — Telephone Encounter (Signed)
Error

## 2020-04-09 DIAGNOSIS — N819 Female genital prolapse, unspecified: Secondary | ICD-10-CM | POA: Diagnosis not present

## 2020-04-09 DIAGNOSIS — Z01419 Encounter for gynecological examination (general) (routine) without abnormal findings: Secondary | ICD-10-CM | POA: Diagnosis not present

## 2020-04-09 DIAGNOSIS — Z6829 Body mass index (BMI) 29.0-29.9, adult: Secondary | ICD-10-CM | POA: Diagnosis not present

## 2020-04-09 DIAGNOSIS — Z1231 Encounter for screening mammogram for malignant neoplasm of breast: Secondary | ICD-10-CM | POA: Diagnosis not present

## 2020-04-15 ENCOUNTER — Other Ambulatory Visit: Payer: Self-pay | Admitting: Obstetrics and Gynecology

## 2020-04-15 DIAGNOSIS — R928 Other abnormal and inconclusive findings on diagnostic imaging of breast: Secondary | ICD-10-CM

## 2020-04-21 DIAGNOSIS — H6981 Other specified disorders of Eustachian tube, right ear: Secondary | ICD-10-CM | POA: Diagnosis not present

## 2020-04-21 DIAGNOSIS — H6123 Impacted cerumen, bilateral: Secondary | ICD-10-CM | POA: Diagnosis not present

## 2020-04-21 DIAGNOSIS — R42 Dizziness and giddiness: Secondary | ICD-10-CM | POA: Diagnosis not present

## 2020-05-06 ENCOUNTER — Other Ambulatory Visit: Payer: Self-pay

## 2020-05-06 ENCOUNTER — Other Ambulatory Visit: Payer: Self-pay | Admitting: Obstetrics and Gynecology

## 2020-05-06 ENCOUNTER — Ambulatory Visit
Admission: RE | Admit: 2020-05-06 | Discharge: 2020-05-06 | Disposition: A | Discharge status: Home / Self Care | Payer: Medicare HMO | Source: Ambulatory Visit | Attending: Obstetrics and Gynecology | Admitting: Obstetrics and Gynecology

## 2020-05-06 DIAGNOSIS — R928 Other abnormal and inconclusive findings on diagnostic imaging of breast: Secondary | ICD-10-CM

## 2020-05-06 DIAGNOSIS — R921 Mammographic calcification found on diagnostic imaging of breast: Secondary | ICD-10-CM | POA: Diagnosis not present

## 2020-05-13 ENCOUNTER — Other Ambulatory Visit: Payer: Self-pay

## 2020-05-13 ENCOUNTER — Other Ambulatory Visit: Payer: Self-pay | Admitting: Obstetrics and Gynecology

## 2020-05-13 ENCOUNTER — Ambulatory Visit
Admission: RE | Admit: 2020-05-13 | Discharge: 2020-05-13 | Disposition: A | Discharge status: Home / Self Care | Payer: Medicare HMO | Source: Ambulatory Visit | Attending: Obstetrics and Gynecology | Admitting: Obstetrics and Gynecology

## 2020-05-13 DIAGNOSIS — R928 Other abnormal and inconclusive findings on diagnostic imaging of breast: Secondary | ICD-10-CM

## 2020-05-13 DIAGNOSIS — R921 Mammographic calcification found on diagnostic imaging of breast: Secondary | ICD-10-CM | POA: Diagnosis not present

## 2020-05-13 DIAGNOSIS — N6011 Diffuse cystic mastopathy of right breast: Secondary | ICD-10-CM | POA: Diagnosis not present

## 2020-05-24 NOTE — Progress Notes (Signed)
Cardiology Office Note    Date:  05/28/2020   ID:  Allison Sanders, DOB 12-20-1950, MRN [id number]  PCP:  Seward Carol, MD  Cardiologist:  Dr. Martinique  Chief Complaint  Patient presents with  . Coronary Artery Disease    History of Present Illness:  Allison Sanders is a 70 y.o. female seen for follow up CAD. She presented in February 2019 with anterior STEMI.  Cardiac catheterization performed on 04/05/2017 showed 100% proximal LAD lesion treated with Synergy 3.0 x 20 mm DES, 100% ostial diagonal occlusion, 25% proximal to mid RCA, 30% proximal left circumflex lesion, EF was 50-55% by LV gram.  Follow-up echocardiogram obtained on 04/06/2017 showed EF 35-40%, akinesis of the mid apical anteroseptal, and apical myocardium, grade 2 DD.    Her serial troponin eventually trended up to 4.75.  Post-cath, patient was placed on aspirin, Lipitor, metoprolol and Brilinta.  In May 2019 Echo was repeated and showed normalization of LV function.  On follow up today she is doing well. She did have some hip problems with torn cartilage. This has improved now and she is going back to the gym. She had a breast bx which was benign. She has a kidney stone in her remaining kidney but this hasn't bothered her. No chest pain or dyspnea. Feels well. Has gained weight 9 lbs in past year.    Past Medical History:  Diagnosis Date  . HTN (hypertension)   . Kidney stone     Past Surgical History:  Procedure Laterality Date  . CORONARY STENT INTERVENTION N/A 04/05/2017   Procedure: CORONARY STENT INTERVENTION;  Surgeon: Martinique, Cordarrell Sane M, MD;  Location: [facility];  Service: Cardiovascular;  Laterality: N/A;  . CORONARY/GRAFT ACUTE MI REVASCULARIZATION N/A 04/05/2017   Procedure: Coronary/Graft Acute MI Revascularization;  Surgeon: Martinique, Kierrah Kilbride M, MD;  Location: [facility];  Service: Cardiovascular;  Laterality: N/A;  . DILATION AND CURETTAGE, DIAGNOSTIC / THERAPEUTIC    . LEFT HEART CATH  AND CORONARY ANGIOGRAPHY N/A 04/05/2017   Procedure: LEFT HEART CATH AND CORONARY ANGIOGRAPHY;  Surgeon: Martinique, Donnalynn Wheeless M, MD;  Location: [facility];  Service: Cardiovascular;  Laterality: N/A;  . left nephrectomy      Current Medications: Outpatient Medications Prior to Visit  Medication Sig Dispense Refill  . aspirin 81 MG chewable tablet Chew 1 tablet (81 mg total) by mouth daily. 30 tablet 2  . atorvastatin (LIPITOR) 40 MG tablet TAKE 1 TABLET BY MOUTH EVERY DAY 90 tablet 3  . cholecalciferol (VITAMIN D) 1000 UNITS tablet Take 2,000 Units by mouth daily.    . irbesartan (AVAPRO) 75 MG tablet TAKE 1 TABLET BY MOUTH EVERY DAY 30 tablet 6  . metoprolol succinate (TOPROL-XL) 25 MG 24 hr tablet Take 1 tablet (25 mg total) by mouth daily. 30 tablet 11  . vitamin C (ASCORBIC ACID) 250 MG tablet Take 250 mg by mouth daily.    . nitroGLYCERIN (NITROSTAT) 0.4 MG SL tablet Place 1 tablet (0.4 mg total) under the tongue every 5 (five) minutes x 3 doses as needed for chest pain. 25 tablet 11   No facility-administered medications prior to visit.     Allergies:   Penicillins, Sulfa antibiotics, and Contrast media [iodinated diagnostic agents]   Social History   Socioeconomic History  . Marital status: Single    Spouse name: Not on file  . Number of children: 2  . Years of education: Not on file  . Highest education  level: Not on file  Occupational History  . Not on file  Tobacco Use  . Smoking status: Never Smoker  . Smokeless tobacco: Never Used  Vaping Use  . Vaping Use: Never used  Substance and Sexual Activity  . Alcohol use: No    Alcohol/week: 0.0 standard drinks  . Drug use: No  . Sexual activity: Not on file  Other Topics Concern  . Not on file  Social History Narrative  . Not on file   Social Determinants of Health   Financial Resource Strain: Not on file  Food Insecurity: Not on file  Transportation Needs: Not on file  Physical Activity: Not on file  Stress:  Not on file  Social Connections: Not on file     Family History:  The patient's family history includes Heart attack in her father.   ROS:   Please see the history of present illness.    ROS All other systems reviewed and are negative.   PHYSICAL EXAM:   VS:  BP 126/62   Pulse (!) 58   Ht 5\' 5"  (1.651 m)   Wt 170 lb (77.1 kg)   SpO2 98%   BMI 28.29 kg/m    GENERAL:  Well appearing WF in NAD HEENT:  PERRL, EOMI, sclera are clear. Oropharynx is clear. NECK:  No jugular venous distention, carotid upstroke brisk and symmetric, no bruits, no thyromegaly or adenopathy LUNGS:  Clear to auscultation bilaterally CHEST:  Unremarkable HEART:  RRR,  PMI not displaced or sustained,S1 and S2 within normal limits, no S3, no S4: no clicks, no rubs, no murmurs ABD:  Soft, nontender. BS +, no masses or bruits. No hepatomegaly, no splenomegaly EXT:  2 + pulses throughout, no edema, no cyanosis no clubbing SKIN:  Warm and dry.  No rashes NEURO:  Alert and oriented x 3. Cranial nerves II through XII intact. PSYCH:  Cognitively intact      Wt Readings from Last 3 Encounters:  05/28/20 170 lb (77.1 kg)  01/11/20 165 lb (74.8 kg)  12/05/19 167 lb (75.8 kg)      Studies/Labs Reviewed:   EKG:  EKG is rdered today. NSR rate 58, normal Ecg. I have personally reviewed and interpreted this study.    Recent Labs: No results found for requested labs within last 8760 hours.   Lipid Panel    Component Value Date/Time   CHOL 98 (L) 05/25/2019 1010   TRIG 91 05/25/2019 1010   HDL 52 05/25/2019 1010   CHOLHDL 1.9 05/25/2019 1010   CHOLHDL 3.3 04/05/2017 2044   VLDL 25 04/05/2017 2044   LDLCALC 28 05/25/2019 1010    Additional studies/ records that were reviewed today include:   Cath 04/05/2017 Conclusion     Prox LAD lesion is 100% stenosed.  A drug-eluting stent was successfully placed using a STENT SYNERGY DES 3X20.  Post intervention, there is a 0% residual stenosis.  Ost  1st Diag lesion is 100% stenosed.  Prox RCA to Mid RCA lesion is 25% stenosed.  Prox Cx lesion is 30% stenosed.  The left ventricular systolic function is normal.  LV end diastolic pressure is normal.  The left ventricular ejection fraction is 50-55% by visual estimate.   1. Single vessel occlusive CAD 2. Mild LV dysfunction with apical wall motion abnormality. 3. Normal LVEDP 4. Successful PCI and stenting of the mid LAD with DES  Plan: DAPT for one year. Risk factor modification.     Echo 04/06/2017 LV EF: 35% -  40%  ------------------------------------------------------------------- Indications:      CAD of native vessels 414.01.  ------------------------------------------------------------------- History:   Risk factors:  Hypertension.  ------------------------------------------------------------------- Study Conclusions  - Left ventricle: The cavity size was normal. Wall thickness was   normal. Systolic function was moderately reduced. The estimated   ejection fraction was in the range of 35% to 40%. Akinesis of the   mid-apicalanteroseptal and apical myocardium. Features are   consistent with a pseudonormal left ventricular filling pattern,   with concomitant abnormal relaxation and increased filling   pressure (grade 2 diastolic dysfunction).  Echo 07/06/17: Study Conclusions  - Left ventricle: The cavity size was normal. Systolic function was   normal. The estimated ejection fraction was in the range of 55%   to 60%. Wall motion was normal; there were no regional wall   motion abnormalities. Left ventricular diastolic function   parameters were normal. - Aortic valve: Trileaflet; mildly thickened, mildly calcified   leaflets. - Mitral valve: Calcified annulus. Mild thickening and   calcification of the anterior leaflet. There was trivial   regurgitation. - Atrial septum: There was increased thickness of the septum,   consistent with lipomatous  hypertrophy. - Pulmonic valve: There was trivial regurgitation.  Impressions:  - Compared to prior echo, LVF has normalized.  ASSESSMENT:    1. Coronary artery disease involving native coronary artery of native heart without angina pectoris   2. Essential hypertension   3. Hypercholesteremia      PLAN:  In order of problems listed above:  1. CAD: s/p  anterior STEMI with emergent DES of LAD 04/05/17.  Continue aspirin,  Lipitor, beta-blocker. Continue lifestyle modification. Encourage increased aerobic activity and weight loss.  2. Ischemic cardiomyopathy: EF 35-40%. Repeat Echo in May 2020 showed normal LV function.  Continue ARB and beta blocker.  3. Hypertension: Blood pressure is  well controlled now.   4.   HLD on statin. Will check fasting labs   Follow up in one year.   Medication Adjustments/Labs and Tests Ordered: Current medicines are reviewed at length with the patient today.  Concerns regarding medicines are outlined above.  Medication changes, Labs and Tests ordered today are listed in the Patient Instructions below. There are no Patient Instructions on file for this visit.   Signed, Laquan Beier Martinique, MD  05/28/2020 3:31 PM    [facility] [address], [place], [place]  [address] Phone: [phone]; Fax: [phone]

## 2020-05-28 ENCOUNTER — Encounter: Payer: Self-pay | Admitting: Cardiology

## 2020-05-28 ENCOUNTER — Ambulatory Visit: Payer: Medicare HMO | Admitting: Cardiology

## 2020-05-28 ENCOUNTER — Other Ambulatory Visit: Payer: Self-pay

## 2020-05-28 VITALS — BP 126/62 | HR 58 | Ht 65.0 in | Wt 170.0 lb

## 2020-05-28 DIAGNOSIS — I2510 Atherosclerotic heart disease of native coronary artery without angina pectoris: Secondary | ICD-10-CM

## 2020-05-28 DIAGNOSIS — I10 Essential (primary) hypertension: Secondary | ICD-10-CM | POA: Diagnosis not present

## 2020-05-28 DIAGNOSIS — E7800 Pure hypercholesterolemia, unspecified: Secondary | ICD-10-CM

## 2020-05-28 MED ORDER — NITROGLYCERIN 0.4 MG SL SUBL: 0.4000 mg | SUBLINGUAL_TABLET | SUBLINGUAL | 11 refills | Status: DC | PRN

## 2020-05-28 NOTE — Patient Instructions (Signed)
Medication Instructions:  Continue same medications *If you need a refill on your cardiac medications before your next appointment, please call your pharmacy*   Lab Work: Bmet,lipid and hepatic panels have done fasting  Lab orders enclosed   Testing/Procedures: None ordered   Follow-Up: At [facility], you and your health needs are our priority.  As part of our continuing mission to provide you with exceptional heart care, we have created designated Provider Care Teams.  These Care Teams include your primary Cardiologist (physician) and Advanced Practice Providers (APPs -  Physician Assistants and Nurse Practitioners) who all work together to provide you with the care you need, when you need it.  We recommend signing up for the patient portal called "MyChart".  Sign up information is provided on this After Visit Summary.  MyChart is used to connect with patients for Virtual Visits (Telemedicine).  Patients are able to view lab/test results, encounter notes, upcoming appointments, etc.  Non-urgent messages can be sent to your provider as well.   To learn more about what you can do with MyChart, go to [email or web address].    Your next appointment:  1 year    Call in Jan to schedule April appointment    The format for your next appointment:  Office     Provider:  Dr.Jordan

## 2020-06-06 DIAGNOSIS — I2510 Atherosclerotic heart disease of native coronary artery without angina pectoris: Secondary | ICD-10-CM | POA: Diagnosis not present

## 2020-06-06 DIAGNOSIS — I10 Essential (primary) hypertension: Secondary | ICD-10-CM | POA: Diagnosis not present

## 2020-06-06 DIAGNOSIS — E7800 Pure hypercholesterolemia, unspecified: Secondary | ICD-10-CM | POA: Diagnosis not present

## 2020-06-06 LAB — LIPID PANEL
Chol/HDL Ratio: 1.8 ratio (ref 0.0–4.4)
Cholesterol, Total: 100 mg/dL (ref 100–199)
HDL: 56 mg/dL (ref >39)
LDL Chol Calc (NIH): 30 mg/dL (ref 0–99)
Triglycerides: 62 mg/dL (ref 0–149)
VLDL Cholesterol Cal: 14 mg/dL (ref 5–40)

## 2020-06-06 LAB — HEPATIC FUNCTION PANEL
ALT: 21 IU/L (ref 0–32)
AST: 22 IU/L (ref 0–40)
Albumin: 4.1 g/dL (ref 3.8–4.8)
Alkaline Phosphatase: 81 IU/L (ref 44–121)
Bilirubin Total: 0.5 mg/dL (ref 0.0–1.2)
Bilirubin, Direct: 0.17 mg/dL (ref 0.00–0.40)
Total Protein: 6.5 g/dL (ref 6.0–8.5)

## 2020-06-06 LAB — BASIC METABOLIC PANEL
BUN/Creatinine Ratio: 21 (ref 12–28)
BUN: 18 mg/dL (ref 8–27)
CO2: 23 mmol/L (ref 20–29)
Calcium: 9.4 mg/dL (ref 8.7–10.3)
Chloride: 105 mmol/L (ref 96–106)
Creatinine, Ser: 0.84 mg/dL (ref 0.57–1.00)
Glucose: 88 mg/dL (ref 65–99)
Potassium: 4.2 mmol/L (ref 3.5–5.2)
Sodium: 143 mmol/L (ref 134–144)
eGFR: 75 mL/min/{1.73_m2} (ref >59)

## 2020-06-16 ENCOUNTER — Other Ambulatory Visit: Payer: Self-pay | Admitting: Cardiology

## 2020-07-31 DIAGNOSIS — S40261A Insect bite (nonvenomous) of right shoulder, initial encounter: Secondary | ICD-10-CM | POA: Diagnosis not present

## 2020-07-31 DIAGNOSIS — L03116 Cellulitis of left lower limb: Secondary | ICD-10-CM | POA: Diagnosis not present

## 2020-07-31 DIAGNOSIS — W57XXXA Bitten or stung by nonvenomous insect and other nonvenomous arthropods, initial encounter: Secondary | ICD-10-CM | POA: Diagnosis not present

## 2020-07-31 DIAGNOSIS — S20461A Insect bite (nonvenomous) of right back wall of thorax, initial encounter: Secondary | ICD-10-CM | POA: Diagnosis not present

## 2020-07-31 DIAGNOSIS — S30860A Insect bite (nonvenomous) of lower back and pelvis, initial encounter: Secondary | ICD-10-CM | POA: Diagnosis not present

## 2020-07-31 DIAGNOSIS — S40262A Insect bite (nonvenomous) of left shoulder, initial encounter: Secondary | ICD-10-CM | POA: Diagnosis not present

## 2020-07-31 DIAGNOSIS — S1096XA Insect bite of unspecified part of neck, initial encounter: Secondary | ICD-10-CM | POA: Diagnosis not present

## 2020-07-31 DIAGNOSIS — S20462A Insect bite (nonvenomous) of left back wall of thorax, initial encounter: Secondary | ICD-10-CM | POA: Diagnosis not present

## 2020-07-31 DIAGNOSIS — L299 Pruritus, unspecified: Secondary | ICD-10-CM | POA: Diagnosis not present

## 2020-07-31 DIAGNOSIS — S30861A Insect bite (nonvenomous) of abdominal wall, initial encounter: Secondary | ICD-10-CM | POA: Diagnosis not present

## 2020-10-15 ENCOUNTER — Other Ambulatory Visit: Payer: Self-pay | Admitting: Cardiology

## 2020-10-16 DIAGNOSIS — N819 Female genital prolapse, unspecified: Secondary | ICD-10-CM | POA: Diagnosis not present

## 2020-11-23 DIAGNOSIS — J029 Acute pharyngitis, unspecified: Secondary | ICD-10-CM | POA: Diagnosis not present

## 2020-11-23 DIAGNOSIS — R059 Cough, unspecified: Secondary | ICD-10-CM | POA: Diagnosis not present

## 2020-11-23 DIAGNOSIS — B349 Viral infection, unspecified: Secondary | ICD-10-CM | POA: Diagnosis not present

## 2020-11-23 DIAGNOSIS — U071 COVID-19: Secondary | ICD-10-CM | POA: Diagnosis not present

## 2021-01-13 ENCOUNTER — Other Ambulatory Visit: Payer: Self-pay | Admitting: Cardiology

## 2021-01-13 NOTE — Telephone Encounter (Signed)
Rx request sent to pharmacy.  

## 2021-01-22 DIAGNOSIS — N8110 Cystocele, unspecified: Secondary | ICD-10-CM | POA: Diagnosis not present

## 2021-01-23 DIAGNOSIS — N812 Incomplete uterovaginal prolapse: Secondary | ICD-10-CM | POA: Diagnosis not present

## 2021-02-03 DIAGNOSIS — N819 Female genital prolapse, unspecified: Secondary | ICD-10-CM | POA: Diagnosis not present

## 2021-02-10 DIAGNOSIS — N200 Calculus of kidney: Secondary | ICD-10-CM | POA: Diagnosis not present

## 2021-02-10 DIAGNOSIS — Z85528 Personal history of other malignant neoplasm of kidney: Secondary | ICD-10-CM | POA: Diagnosis not present

## 2021-03-10 DIAGNOSIS — N200 Calculus of kidney: Secondary | ICD-10-CM | POA: Diagnosis not present

## 2021-04-14 DIAGNOSIS — Z1231 Encounter for screening mammogram for malignant neoplasm of breast: Secondary | ICD-10-CM | POA: Diagnosis not present

## 2021-04-14 DIAGNOSIS — Z6830 Body mass index (BMI) 30.0-30.9, adult: Secondary | ICD-10-CM | POA: Diagnosis not present

## 2021-04-14 DIAGNOSIS — Z01419 Encounter for gynecological examination (general) (routine) without abnormal findings: Secondary | ICD-10-CM | POA: Diagnosis not present

## 2021-04-14 DIAGNOSIS — Z124 Encounter for screening for malignant neoplasm of cervix: Secondary | ICD-10-CM | POA: Diagnosis not present

## 2021-05-13 ENCOUNTER — Other Ambulatory Visit: Payer: Self-pay | Admitting: Cardiology

## 2021-05-21 DIAGNOSIS — L709 Acne, unspecified: Secondary | ICD-10-CM | POA: Diagnosis not present

## 2021-06-12 NOTE — Progress Notes (Signed)
? ?Cardiology Office Note   ? ?Date:  06/12/2021  ? ?ID:  Allison Sanders, DOB 02/11/1951, MRN [id number] ? ?PCP:  Seward Carol, MD  ?Cardiologist:  Dr. Martinique ? ?No chief complaint on file. ? ? ?History of Present Illness:  ?Allison Sanders is a 71 y.o. female seen for follow up CAD. She presented in February 2019 with anterior STEMI.  Cardiac catheterization performed on 04/05/2017 showed 100% proximal LAD lesion treated with Synergy 3.0 x 20 mm DES, 100% ostial diagonal occlusion, 25% proximal to mid RCA, 30% proximal left circumflex lesion, EF was 50-55% by LV gram.  Follow-up echocardiogram obtained on 04/06/2017 showed EF 35-40%, akinesis of the mid apical anteroseptal, and apical myocardium, grade 2 DD.    Her serial troponin eventually trended up to 4.75.  Post-cath, patient was placed on aspirin, Lipitor, metoprolol and Brilinta. ? ?In May 2019 Echo was repeated and showed normalization of LV function. ? ?On follow up today she is doing well from a cardiac standpoint. No chest pain or dizzy. Feels groggy sometimes. Has gained weight -14 lbs over 2 years and 5 lbs since last year. Not able to be as active due to problems with bladder prolapse.  ? ? ?Past Medical History:  ?Diagnosis Date  ? HTN (hypertension)   ? Kidney stone   ? ? ?Past Surgical History:  ?Procedure Laterality Date  ? CORONARY STENT INTERVENTION N/A 04/05/2017  ? Procedure: CORONARY STENT INTERVENTION;  Surgeon: Martinique, Caroll Weinheimer M, MD;  Location: [facility];  Service: Cardiovascular;  Laterality: N/A;  ? CORONARY/GRAFT ACUTE MI REVASCULARIZATION N/A 04/05/2017  ? Procedure: Coronary/Graft Acute MI Revascularization;  Surgeon: Martinique, Yitzhak Awan M, MD;  Location: [facility];  Service: Cardiovascular;  Laterality: N/A;  ? DILATION AND CURETTAGE, DIAGNOSTIC / THERAPEUTIC    ? LEFT HEART CATH AND CORONARY ANGIOGRAPHY N/A 04/05/2017  ? Procedure: LEFT HEART CATH AND CORONARY ANGIOGRAPHY;  Surgeon: Martinique, Mileah Hemmer M, MD;  Location: [facility];  Service: Cardiovascular;  Laterality: N/A;  ? left nephrectomy    ? ? ?Current Medications: ?Outpatient Medications Prior to Visit  ?Medication Sig Dispense Refill  ? aspirin 81 MG chewable tablet Chew 1 tablet (81 mg total) by mouth daily. 30 tablet 2  ? atorvastatin (LIPITOR) 40 MG tablet Take 1 tablet (40 mg total) by mouth daily. 90 tablet 1  ? cholecalciferol (VITAMIN D) 1000 UNITS tablet Take 2,000 Units by mouth daily.    ? irbesartan (AVAPRO) 75 MG tablet TAKE 1 TABLET BY MOUTH EVERY DAY 30 tablet 2  ? metoprolol succinate (TOPROL-XL) 25 MG 24 hr tablet TAKE 1 TABLET BY MOUTH EVERY DAY 90 tablet 3  ? nitroGLYCERIN (NITROSTAT) 0.4 MG SL tablet Place 1 tablet (0.4 mg total) under the tongue every 5 (five) minutes x 3 doses as needed for chest pain. 25 tablet 11  ? vitamin C (ASCORBIC ACID) 250 MG tablet Take 250 mg by mouth daily.    ? ?No facility-administered medications prior to visit.  ?  ? ?Allergies:   Penicillins, Sulfa antibiotics, and Contrast media [iodinated contrast media]  ? ?Social History  ? ?Socioeconomic History  ? Marital status: Single  ?  Spouse name: Not on file  ? Number of children: 2  ? Years of education: Not on file  ? Highest education level: Not on file  ?Occupational History  ? Not on file  ?Tobacco Use  ? Smoking status: Never  ? Smokeless tobacco: Never  ?Vaping  Use  ? Vaping Use: Never used  ?Substance and Sexual Activity  ? Alcohol use: No  ?  Alcohol/week: 0.0 standard drinks  ? Drug use: No  ? Sexual activity: Not on file  ?Other Topics Concern  ? Not on file  ?Social History Narrative  ? Not on file  ? ?Social Determinants of Health  ? ?Financial Resource Strain: Not on file  ?Food Insecurity: Not on file  ?Transportation Needs: Not on file  ?Physical Activity: Not on file  ?Stress: Not on file  ?Social Connections: Not on file  ?  ? ?Family History:  The patient's family history includes Heart attack in her father.  ? ?ROS:   ?Please see the history of  present illness.    ?ROS All other systems reviewed and are negative. ? ? ?PHYSICAL EXAM:   ?VS:  There were no vitals taken for this visit.   ?GENERAL:  Well appearing WF in NAD ?HEENT:  PERRL, EOMI, sclera are clear. Oropharynx is clear. ?NECK:  No jugular venous distention, carotid upstroke brisk and symmetric, no bruits, no thyromegaly or adenopathy ?LUNGS:  Clear to auscultation bilaterally ?CHEST:  Unremarkable ?HEART:  RRR,  PMI not displaced or sustained,S1 and S2 within normal limits, no S3, no S4: no clicks, no rubs, no murmurs ?ABD:  Soft, nontender. BS +, no masses or bruits. No hepatomegaly, no splenomegaly ?EXT:  2 + pulses throughout, no edema, no cyanosis no clubbing ?SKIN:  Warm and dry.  No rashes ?NEURO:  Alert and oriented x 3. Cranial nerves II through XII intact. ?PSYCH:  Cognitively intact ? ? ? ? ? ?Wt Readings from Last 3 Encounters:  ?05/28/20 170 lb (77.1 kg)  ?01/11/20 165 lb (74.8 kg)  ?12/05/19 167 lb (75.8 kg)  ?  ? ? ?Studies/Labs Reviewed:  ? ?EKG:  EKG is rdered today. NSR rate 68, normal Ecg. I have personally reviewed and interpreted this study. ? ? ? ?Recent Labs: ?No results found for requested labs within last 8760 hours.  ? ?Lipid Panel ?   ?Component Value Date/Time  ? CHOL 100 06/06/2020 0824  ? TRIG 62 06/06/2020 0824  ? HDL 56 06/06/2020 0824  ? CHOLHDL 1.8 06/06/2020 0824  ? CHOLHDL 3.3 04/05/2017 2044  ? VLDL 25 04/05/2017 2044  ? [place] 30 06/06/2020 0824  ? ? ?Additional studies/ records that were reviewed today include:  ? ?Cath 04/05/2017 ?Conclusion  ? ?  ?Prox LAD lesion is 100% stenosed. ?A drug-eluting stent was successfully placed using a STENT SYNERGY DES 3X20. ?Post intervention, there is a 0% residual stenosis. ?Ost 1st Diag lesion is 100% stenosed. ?Prox RCA to Mid RCA lesion is 25% stenosed. ?Prox Cx lesion is 30% stenosed. ?The left ventricular systolic function is normal. ?LV end diastolic pressure is normal. ?The left ventricular ejection fraction is  50-55% by visual estimate. ?  ?1. Single vessel occlusive CAD ?2. Mild LV dysfunction with apical wall motion abnormality. ?3. Normal LVEDP ?4. Successful PCI and stenting of the mid LAD with DES ?  ?Plan: DAPT for one year. Risk factor modification.   ? ? ?Echo 04/06/2017 ?LV EF: 35% -   40% ?  ?------------------------------------------------------------------- ?Indications:      CAD of native vessels 414.01. ?  ?------------------------------------------------------------------- ?History:   Risk factors:  Hypertension. ?  ?------------------------------------------------------------------- ?Study Conclusions ?  ?- Left ventricle: The cavity size was normal. Wall thickness was ?  normal. Systolic function was moderately reduced. The estimated ?  ejection fraction was  in the range of 35% to 40%. Akinesis of the ?  mid-apicalanteroseptal and apical myocardium. Features are ?  consistent with a pseudonormal left ventricular filling pattern, ?  with concomitant abnormal relaxation and increased filling ?  pressure (grade 2 diastolic dysfunction). ? ?Echo 07/06/17: Study Conclusions ?  ?- Left ventricle: The cavity size was normal. Systolic function was ?  normal. The estimated ejection fraction was in the range of 55% ?  to 60%. Wall motion was normal; there were no regional wall ?  motion abnormalities. Left ventricular diastolic function ?  parameters were normal. ?- Aortic valve: Trileaflet; mildly thickened, mildly calcified ?  leaflets. ?- Mitral valve: Calcified annulus. Mild thickening and ?  calcification of the anterior leaflet. There was trivial ?  regurgitation. ?- Atrial septum: There was increased thickness of the septum, ?  consistent with lipomatous hypertrophy. ?- Pulmonic valve: There was trivial regurgitation. ?  ?Impressions: ?  ?- Compared to prior echo, LVF has normalized. ? ?ASSESSMENT:   ? ?No diagnosis found. ? ? ? ?PLAN:  ?In order of problems listed above: ? ?CAD: s/p  anterior STEMI with  emergent DES of LAD 04/05/17.  Continue aspirin,  Lipitor, beta-blocker. Continue lifestyle modification. Encourage increased aerobic activity and weight loss. ? ?Ischemic cardiomyopathy: EF 35-40%. Repeat Echo in M

## 2021-06-16 ENCOUNTER — Encounter: Payer: Self-pay | Admitting: Cardiology

## 2021-06-16 ENCOUNTER — Other Ambulatory Visit: Payer: Self-pay

## 2021-06-16 ENCOUNTER — Ambulatory Visit: Payer: Medicare HMO | Admitting: Cardiology

## 2021-06-16 VITALS — BP 140/80 | HR 68 | Ht 65.0 in | Wt 174.8 lb

## 2021-06-16 DIAGNOSIS — I2510 Atherosclerotic heart disease of native coronary artery without angina pectoris: Secondary | ICD-10-CM

## 2021-06-16 DIAGNOSIS — I10 Essential (primary) hypertension: Secondary | ICD-10-CM | POA: Diagnosis not present

## 2021-06-16 DIAGNOSIS — E7800 Pure hypercholesterolemia, unspecified: Secondary | ICD-10-CM

## 2021-06-16 MED ORDER — METOPROLOL SUCCINATE ER 25 MG PO TB24: 25.0000 mg | ORAL_TABLET | Freq: Every day | ORAL | 3 refills | Status: DC

## 2021-06-16 MED ORDER — NITROGLYCERIN 0.4 MG SL SUBL: 0.4000 mg | SUBLINGUAL_TABLET | SUBLINGUAL | 11 refills | Status: DC | PRN

## 2021-06-17 LAB — HEPATIC FUNCTION PANEL
ALT: 18 IU/L (ref 0–32)
AST: 19 IU/L (ref 0–40)
Albumin: 4.3 g/dL (ref 3.7–4.7)
Alkaline Phosphatase: 90 IU/L (ref 44–121)
Bilirubin Total: 0.6 mg/dL (ref 0.0–1.2)
Bilirubin, Direct: 0.19 mg/dL (ref 0.00–0.40)
Total Protein: 7 g/dL (ref 6.0–8.5)

## 2021-06-17 LAB — LIPID PANEL
Chol/HDL Ratio: 1.9 ratio (ref 0.0–4.4)
Cholesterol, Total: 110 mg/dL (ref 100–199)
HDL: 57 mg/dL (ref >39)
LDL Chol Calc (NIH): 36 mg/dL (ref 0–99)
Triglycerides: 87 mg/dL (ref 0–149)
VLDL Cholesterol Cal: 17 mg/dL (ref 5–40)

## 2021-06-17 LAB — BASIC METABOLIC PANEL
BUN/Creatinine Ratio: 14 (ref 12–28)
BUN: 14 mg/dL (ref 8–27)
CO2: 25 mmol/L (ref 20–29)
Calcium: 9.8 mg/dL (ref 8.7–10.3)
Chloride: 106 mmol/L (ref 96–106)
Creatinine, Ser: 0.98 mg/dL (ref 0.57–1.00)
Glucose: 92 mg/dL (ref 70–99)
Potassium: 4.7 mmol/L (ref 3.5–5.2)
Sodium: 144 mmol/L (ref 134–144)
eGFR: 62 mL/min/{1.73_m2} (ref >59)

## 2021-06-26 ENCOUNTER — Other Ambulatory Visit: Payer: Self-pay | Admitting: Cardiology

## 2021-07-31 ENCOUNTER — Other Ambulatory Visit: Payer: Self-pay | Admitting: Cardiology

## 2021-09-01 DIAGNOSIS — N819 Female genital prolapse, unspecified: Secondary | ICD-10-CM | POA: Diagnosis not present

## 2021-09-01 DIAGNOSIS — N812 Incomplete uterovaginal prolapse: Secondary | ICD-10-CM | POA: Diagnosis not present

## 2021-09-29 DIAGNOSIS — L718 Other rosacea: Secondary | ICD-10-CM | POA: Diagnosis not present

## 2021-09-29 DIAGNOSIS — B351 Tinea unguium: Secondary | ICD-10-CM | POA: Diagnosis not present

## 2021-11-26 DIAGNOSIS — M5432 Sciatica, left side: Secondary | ICD-10-CM | POA: Diagnosis not present

## 2021-11-26 DIAGNOSIS — C189 Malignant neoplasm of colon, unspecified: Secondary | ICD-10-CM | POA: Diagnosis not present

## 2021-12-02 ENCOUNTER — Ambulatory Visit: Payer: Medicare HMO | Admitting: Family Medicine

## 2021-12-02 VITALS — BP 202/98 | HR 90 | Ht 65.0 in

## 2021-12-02 DIAGNOSIS — G8929 Other chronic pain: Secondary | ICD-10-CM

## 2021-12-02 DIAGNOSIS — M545 Low back pain, unspecified: Secondary | ICD-10-CM | POA: Diagnosis not present

## 2021-12-02 MED ORDER — TIZANIDINE HCL 4 MG PO TABS: 2.0000 mg | ORAL_TABLET | Freq: Four times a day (QID) | ORAL | 1 refills | Status: DC | PRN

## 2021-12-02 NOTE — Progress Notes (Signed)
I, Peterson Lombard, LAT, ATC acting as a scribe for Lynne Leader, MD.  Subjective:    CC: Low back pain  HPI: Patient is a 71 year old female complaining of low back pain.  Patient was previously seen in 2021 by Dr. Tamala Julian for greater troch bursitis of the left hip.  Today, patient reports low back pain ongoing since last Tues, 10/3. 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 Pt was sitting next to his bed and was rotating taking care of his wounds. Pt felt a "cramp-like" pain in her L buttock.  Patient locates pain to the L-side of her low back, w/ radiating pain into her L buttock. Friday, pt's son had to be taken to the hospital and she was standing for 10 hours.   Radiating pain: yes LE numbness/tingling: no LE weakness: no Aggravates: sitting, L-side lying Treatments tried: heat, prednisone, methocarbamol, IM injection w/ PCP  Dx imaging: 03/25/2020 left hip MRI 12/05/2019 lumbar spine and left hip x-ray 08/14/2015 L-spine x-ray  Pertinent review of Systems: No fevers or chills  Relevant historical information: STEMI.  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   Objective:    Vitals:   12/02/21 1443  BP: (!) 202/98  Pulse: 90  SpO2: 98%   General: Well Developed, well nourished, and in no acute distress.   MSK: L-spine: Nontender midline.  Tender palpation left lumbar paraspinal musculature. Decreased lumbar motion. Lower extremity strength is intact. Reflexes are intact.  Lab and Radiology Results  EXAM: LUMBAR SPINE - 2-3 VIEW   COMPARISON:  August 14, 2015   FINDINGS: There is a degenerative levoscoliosis centered in the mid lumbar spine. Multilevel degenerative changes are noted throughout the lumbar spine with multilevel disc height loss and facet arthrosis, greatest in the lower lumbar segments. There is no acute osseous abnormality.   IMPRESSION: 1. No acute osseous abnormality. 2.  Degenerative levoscoliosis. 3. Multilevel degenerative changes of the lumbar spine.     Electronically Signed   By: Constance Holster M.D.   On: 12/07/2019 15:55   I, Lynne Leader, personally (independently) visualized and performed the interpretation of the images attached in this note.     Impression and Recommendations:    Assessment and Plan: 71 y.o. female with acute exacerbation of left low back pain.  Patient originally may have had some radicular pain but that resolved with prednisone prescribed by her primary care provider.  Today's symptoms are much more consistent with lumbosacral strain and spasm.  Plan to treat with tizanidine heating pad TENS unit and physical therapy.  Can refill prednisone if needed.  Could switch to cyclobenzaprine if needed as well.  She will keep me updated.  Check back as needed.Marland Kitchen  PDMP not reviewed this encounter. Orders Placed This Encounter  Procedures   Ambulatory referral to Physical Therapy    Referral Priority:   Routine    Referral Type:   Physical Medicine    Referral Reason:   Specialty Services Required    Requested Specialty:   Physical Therapy    Number of Visits Requested:   1   Meds ordered this encounter  Medications   tiZANidine (ZANAFLEX) 4 MG tablet    Sig: Take 0.5-1 tablets (2-4 mg total) by mouth every 6 (six) hours as needed for muscle spasms.    Dispense:  30 tablet    Refill:  1    Discussed warning signs or  symptoms. Please see discharge instructions. Patient expresses understanding.   The above documentation has been reviewed and is accurate and complete Lynne Leader, M.D.

## 2021-12-02 NOTE — Patient Instructions (Addendum)
Thank you for coming in today.   Try a heating pad.   Try the tizanidine.   Try a TENS unit.   Plan for PT.   Let me know how it goes.

## 2021-12-05 ENCOUNTER — Telehealth: Payer: Self-pay | Admitting: Family Medicine

## 2021-12-05 NOTE — Telephone Encounter (Signed)
Patient called stating that she does not like the way that the Zanaflex makes her feel. She asked if she could go ahead and try the Flexeril?  Please advise.

## 2021-12-08 MED ORDER — CYCLOBENZAPRINE HCL 5 MG PO TABS: 5.0000 mg | ORAL_TABLET | Freq: Every evening | ORAL | 1 refills | Status: DC | PRN

## 2021-12-08 NOTE — Telephone Encounter (Signed)
New Rx for flexeril sent to the [facility] on [address]

## 2021-12-12 ENCOUNTER — Ambulatory Visit: Payer: Medicare HMO | Admitting: Physical Therapy

## 2021-12-18 ENCOUNTER — Encounter: Payer: Self-pay | Admitting: Physical Therapy

## 2021-12-18 ENCOUNTER — Ambulatory Visit: Payer: Medicare HMO | Admitting: Physical Therapy

## 2021-12-18 DIAGNOSIS — M5459 Other low back pain: Secondary | ICD-10-CM | POA: Diagnosis not present

## 2021-12-18 NOTE — Therapy (Signed)
OUTPATIENT PHYSICAL THERAPY THORACOLUMBAR EVALUATION   Patient Name: Allison Sanders MRN: [id number] DOB:19-Aug-1950, 71 y.o., female Today's Date: 12/18/2021   PT End of Session - 12/18/21 1533     Visit Number 1    Number of Visits 16    Date for PT Re-Evaluation 02/12/22    Authorization Type Aetna Medicare    PT Start Time 2595    PT Stop Time 1523    PT Time Calculation (min) 50 min    Activity Tolerance Patient tolerated treatment well;Patient limited by pain    Behavior During Therapy [facility] for tasks assessed/performed             Past Medical History:  Diagnosis Date   HTN (hypertension)    Kidney stone    Past Surgical History:  Procedure Laterality Date   CORONARY STENT INTERVENTION N/A 04/05/2017   Procedure: CORONARY STENT INTERVENTION;  Surgeon: Martinique, Peter M, MD;  Location: [place] [facility];  Service: Cardiovascular;  Laterality: N/A;   CORONARY/GRAFT ACUTE MI REVASCULARIZATION N/A 04/05/2017   Procedure: Coronary/Graft Acute MI Revascularization;  Surgeon: Martinique, Peter M, MD;  Location: [place] [facility];  Service: Cardiovascular;  Laterality: N/A;   DILATION AND CURETTAGE, DIAGNOSTIC / THERAPEUTIC     LEFT HEART CATH AND CORONARY ANGIOGRAPHY N/A 04/05/2017   Procedure: LEFT HEART CATH AND CORONARY ANGIOGRAPHY;  Surgeon: Martinique, Peter M, MD;  Location: [place] [facility];  Service: Cardiovascular;  Laterality: N/A;   left nephrectomy     Patient Active Problem List   Diagnosis Date Noted   Left hip pain 01/11/2020   Greater trochanteric bursitis of left hip 12/05/2019   STEMI involving left anterior descending coronary artery ([place]) 04/05/2017   HTN (hypertension) 04/05/2017   Acute ST elevation myocardial infarction (STEMI) involving left anterior descending (LAD) coronary artery ([place]) 04/05/2017   Carpal tunnel syndrome, bilateral upper limbs 10/20/2016   Degenerative disc disease, lumbar 09/04/2015   Low back pain 08/14/2015   Arthritis of left  lower extremity 11/19/2014   Arthritis of right lower extremity 11/19/2014    PCP: Seward Carol  REFERRING PROVIDER: Lynne Leader   REFERRING DIAG: Low back pain   Rationale for Evaluation and Treatment: Rehabilitation  THERAPY DIAG:  Other low back pain  ONSET DATE: 3 weeks ago   SUBJECTIVE:                                                                                                                                                                                            SUBJECTIVE STATEMENT:  L side pain in L SI into glute, Significant pain in  sitting. Was having a lot of pain down further into leg/back of thigh, but has improved with prednisone.  Pt has had pain for about 3 weeks.  Has son that she cares for who is quadriplegic, was sitting and turned, had significant pain immediately.  6 years ago, pt does remember having this same pain, resolved in about 4-5 days.  Muscle relaxer helping during the day.  Is able to lay down with less pain, can not sit due to pain, driving painful.  Pt frustrated with amount of pain and duration of pain at this time.      PERTINENT HISTORY:  HTN, Stemi   PAIN:  Are you having pain? Yes: NPRS scale: 8/10 Pain location: L low back into glute  Pain description: Sharp, radiating  Aggravating factors: sitting, getting up from sitting, driving  Relieving factors: muscle relaxer   PRECAUTIONS: None  WEIGHT BEARING RESTRICTIONS: No  FALLS:  Has patient fallen in last 6 months? No   PLOF: Independent  PATIENT GOALS: decreased pain, return to regular activities    OBJECTIVE:   DIAGNOSTIC FINDINGS:  MR L hip: 2022:  1. Small tear of the left superior labrum. 2. No hip fracture, dislocation or avascular necrosis. 3. Lower lumbar spine spondylosis.  DG lumbar spine 2021:  1. No acute osseous abnormality. 2. Degenerative levoscoliosis. 3. Multilevel degenerative changes of the lumbar spine.  COGNITION: Overall cognitive  status: Within functional limits for tasks assessed     SENSATION:   POSTURE: noted scoliosis in t/l spine    PALPATION: Minimal soreness with light palpation of L paraspinals or glute   LUMBAR ROM:   AROM eval  Flexion WFL  Extension Mild limitation  Right lateral flexion   Left lateral flexion   Right rotation   Left rotation    (Blank rows = not tested)  LOWER EXTREMITY ROM:     Hip WFL, unable to fully test, due to pain with positioning   LOWER EXTREMITY MMT:    Knees:4+/5,  Hips: unable to test today due to pain.   LUMBAR SPECIAL TESTS:  Neg SLR,  pt very uncomfortable to sit, unable to sit during session,  Repeated motions for lumbar extension: no increased pain.   GAIT:   TODAY'S TREATMENT:                                                                                                                              DATE:  12/18/2021  Ther ex: Standing extension 2x 10 ,  Prone on elbows x 4 min;  Discussed seated positioning, up on higher chair, with L LE out straight,  Discussed supine positioning with LEs bent up for lumbar decompression.     PATIENT EDUCATION:  Education details: PT POC, Exam findings, HEP , discussed trying position changes when she is having pain to see if she can get pain to change . Person educated: Patient Education method: Explanation, Demonstration, Tactile cues, Verbal cues,  and Handouts Education comprehension: verbalized understanding, returned demonstration, verbal cues required, tactile cues required, and needs further education  HOME EXERCISE PROGRAM: Access Code: [email or web address] URL: [email or web address] Date: 12/18/2021 Prepared by: Lyndee Hensen  Exercises - Standing Lumbar Extension  - 3-5 x daily - 1-2 sets - 10 reps - Static Prone on Elbows  - 2 x daily - 3-5 hold  ASSESSMENT:  CLINICAL IMPRESSION: Patient presents with primary complaint of increased pain in low back and glute. She has significant  pain and much difficulty with regular functional activities. She has inability for sitting or bathing in shower area at this time, due to pain. Discussed options for positioning today. She is able to demonstrate ability for lumbar flexion today without severe pain She is not having significant pain at time of eval today. Difficult to get pain to change with flexion or extension today, but describes inability for sitting and much pain with driving. She has no pain with extension today. Given Standing extension and prone on elbows to try at home when she has onset of pain, to see if positioning will help/change pain. Discussed trying to limit flexion at this time. Pt to benefit from skilled PT to improve deficits and pain.   OBJECTIVE IMPAIRMENTS: decreased activity tolerance, decreased knowledge of use of DME, decreased mobility, difficulty walking, decreased ROM, decreased strength, increased muscle spasms, impaired flexibility, improper body mechanics, and pain.   ACTIVITY LIMITATIONS: lifting, bending, sitting, standing, sleeping, stairs, transfers, bed mobility, bathing, toileting, dressing, reach over head, hygiene/grooming, locomotion level, and caring for others  PARTICIPATION LIMITATIONS: meal prep, cleaning, laundry, driving, shopping, and community activity  PERSONAL FACTORS:  none  are also affecting patient's functional outcome.   REHAB POTENTIAL: Good  CLINICAL DECISION MAKING: Stable/uncomplicated  EVALUATION COMPLEXITY: Low   GOALS: Goals reviewed with patient? Yes  SHORT TERM GOALS: Target date: 01/01/2022  Pt to be independent with initial HEP  Goal status: INITIAL  2.  Pt to report decreased pain to 0-6/10 .   Goal status: INITIAL  3.  Pt to report trying position changes when having pain   Goal status: INITIAL     LONG TERM GOALS: Target date: 02/12/2022  Pt to be independent with final HEP  Goal status: INITIAL  2.  Pt to report decreased pain in back and  LE to 0-3/10 with activity.    Goal status: INITIAL  3.  Pt to demo ability for bend/lift/squat without pain greater than 3/10 to improve ability for caring for son and IADLs.   Goal status: INITIAL  4.  Pt to report being tolerant of sitting for meals or for at least 20 min.   Goal status: INITIAL     PLAN:  PT FREQUENCY: 1-2x/week  PT DURATION: 8 weeks  PLANNED INTERVENTIONS: Therapeutic exercises, Therapeutic activity, Neuromuscular re-education, Balance training, Gait training, Patient/Family education, Self Care, Joint mobilization, Joint manipulation, Stair training, Orthotic/Fit training, DME instructions, Aquatic Therapy, Dry Needling, Electrical stimulation, Spinal manipulation, Spinal mobilization, Cryotherapy, Moist heat, Taping, Traction, Ultrasound, Ionotophoresis '4mg'$ /ml Dexamethasone, and Manual therapy.  PLAN FOR NEXT SESSION:    Lyndee Hensen, PT, DPT 3:45 PM  12/18/21

## 2021-12-23 ENCOUNTER — Ambulatory Visit: Payer: Medicare HMO | Admitting: Physical Therapy

## 2021-12-23 ENCOUNTER — Encounter: Payer: Self-pay | Admitting: Physical Therapy

## 2021-12-23 DIAGNOSIS — M5459 Other low back pain: Secondary | ICD-10-CM

## 2021-12-23 NOTE — Therapy (Addendum)
OUTPATIENT PHYSICAL THERAPY THORACOLUMBAR TREATMENT   Patient Name: Allison Sanders MRN: [id number] DOB:1950-07-21, 71 y.o., female Today's Date: 12/23/2021   PT End of Session - 12/23/21 1313     Visit Number 2    Number of Visits 16    Date for PT Re-Evaluation 02/12/22    Authorization Type Aetna Medicare    PT Start Time [id number]    PT Stop Time 1345    PT Time Calculation (min) 39 min    Activity Tolerance Patient tolerated treatment well;Patient limited by pain    Behavior During Therapy [facility] for tasks assessed/performed             Past Medical History:  Diagnosis Date   HTN (hypertension)    Kidney stone    Past Surgical History:  Procedure Laterality Date   CORONARY STENT INTERVENTION N/A 04/05/2017   Procedure: CORONARY STENT INTERVENTION;  Surgeon: Martinique, Peter M, MD;  Location: [facility];  Service: Cardiovascular;  Laterality: N/A;   CORONARY/GRAFT ACUTE MI REVASCULARIZATION N/A 04/05/2017   Procedure: Coronary/Graft Acute MI Revascularization;  Surgeon: Martinique, Peter M, MD;  Location: [place] [facility];  Service: Cardiovascular;  Laterality: N/A;   DILATION AND CURETTAGE, DIAGNOSTIC / THERAPEUTIC     LEFT HEART CATH AND CORONARY ANGIOGRAPHY N/A 04/05/2017   Procedure: LEFT HEART CATH AND CORONARY ANGIOGRAPHY;  Surgeon: Martinique, Peter M, MD;  Location: [place] [facility];  Service: Cardiovascular;  Laterality: N/A;   left nephrectomy     Patient Active Problem List   Diagnosis Date Noted   Left hip pain 01/11/2020   Greater trochanteric bursitis of left hip 12/05/2019   STEMI involving left anterior descending coronary artery ([place]) 04/05/2017   HTN (hypertension) 04/05/2017   Acute ST elevation myocardial infarction (STEMI) involving left anterior descending (LAD) coronary artery ([place]) 04/05/2017   Carpal tunnel syndrome, bilateral upper limbs 10/20/2016   Degenerative disc disease, lumbar 09/04/2015   Low back pain 08/14/2015   Arthritis of left  lower extremity 11/19/2014   Arthritis of right lower extremity 11/19/2014    PCP: Seward Carol  REFERRING PROVIDER: Lynne Leader   REFERRING DIAG: Low back pain   Rationale for Evaluation and Treatment: Rehabilitation  THERAPY DIAG:  Other low back pain  ONSET DATE: 3 weeks ago   SUBJECTIVE:                                                                                                                                                                                            SUBJECTIVE STATEMENT: 12/23/2021 Pt states some decreased pain. Has been more comfortable in morning  with sleeping position.   Eval: L side pain in L SI into glute, Significant pain in sitting. Was having a lot of pain down further into leg/back of thigh, but has improved with prednisone.  Pt has had pain for about 3 weeks.  Has son that she cares for who is quadriplegic, was sitting and turned, had significant pain immediately.  6 years ago, pt does remember having this same pain, resolved in about 4-5 days.  Muscle relaxer helping during the day.  Is able to lay down with less pain, can not sit due to pain, driving painful.  Pt frustrated with amount of pain and duration of pain at this time.      PERTINENT HISTORY:  HTN, Stemi   PAIN:  Are you having pain? Yes: NPRS scale: 8/10 Pain location: L low back into glute  Pain description: Sharp, radiating  Aggravating factors: sitting, getting up from sitting, driving  Relieving factors: muscle relaxer   PRECAUTIONS: None  WEIGHT BEARING RESTRICTIONS: No  FALLS:  Has patient fallen in last 6 months? No   PLOF: Independent  PATIENT GOALS: decreased pain, return to regular activities    OBJECTIVE:   DIAGNOSTIC FINDINGS:  MR L hip: 2022:  1. Small tear of the left superior labrum. 2. No hip fracture, dislocation or avascular necrosis. 3. Lower lumbar spine spondylosis.  DG lumbar spine 2021:  1. No acute osseous abnormality. 2.  Degenerative levoscoliosis. 3. Multilevel degenerative changes of the lumbar spine.  COGNITION: Overall cognitive status: Within functional limits for tasks assessed     SENSATION:   POSTURE: noted scoliosis in t/l spine    PALPATION: Minimal soreness with light palpation of L paraspinals or glute   LUMBAR ROM:   AROM eval  Flexion WFL  Extension Mild limitation  Right lateral flexion   Left lateral flexion   Right rotation   Left rotation    (Blank rows = not tested)  LOWER EXTREMITY ROM:     Hip WFL, unable to fully test, due to pain with positioning   LOWER EXTREMITY MMT:    Knees:4+/5,  Hips: unable to test today due to pain.   LUMBAR SPECIAL TESTS:  Neg SLR,  pt very uncomfortable to sit, unable to sit during session,  Repeated motions for lumbar extension: no increased pain.   GAIT:   TODAY'S TREATMENT:                                                                                                                              DATE:  12/23/2021   Therapeutic Exercise: Aerobic: Supine: Prone on elbows x 4 min;  Pelvic tilts x 15/slow Seated: Standing: Extension in standing/at wall x 10,  Side glides at wall x10 bil;   Stretches:  Neuromuscular Re-education: Manual Therapy: Light lumbar PA mobs (prone);  Manual stretching for L SKTC, L HS, and L piriformis,  Long leg distraction L ;  PATIENT EDUCATION:  Education details: Reviewed HEP Person educated: Patient Education method: Explanation, Demonstration, Tactile cues, Verbal cues, and Handouts Education comprehension: verbalized understanding, returned demonstration, verbal cues required, tactile cues required, and needs further education  HOME EXERCISE PROGRAM: Access Code: [email or web address]   ASSESSMENT:  CLINICAL IMPRESSION:  Pt with more pain free times in the last few days. Reviewed best positions and what to avoid at this time. HEP updated today. Pt with good ability for flexion and  extension today, still limited with ability for sitting due to pain.   Eval: Patient presents with primary complaint of increased pain in low back and glute. She has significant pain and much difficulty with regular functional activities. She has inability for sitting or bathing in shower area at this time, due to pain. Discussed options for positioning today. She is able to demonstrate ability for lumbar flexion today without severe pain She is not having significant pain at time of eval today. Difficult to get pain to change with flexion or extension today, but describes inability for sitting and much pain with driving. She has no pain with extension today. Given Standing extension and prone on elbows to try at home when she has onset of pain, to see if positioning will help/change pain. Discussed trying to limit flexion at this time. Pt to benefit from skilled PT to improve deficits and pain.   OBJECTIVE IMPAIRMENTS: decreased activity tolerance, decreased knowledge of use of DME, decreased mobility, difficulty walking, decreased ROM, decreased strength, increased muscle spasms, impaired flexibility, improper body mechanics, and pain.   ACTIVITY LIMITATIONS: lifting, bending, sitting, standing, sleeping, stairs, transfers, bed mobility, bathing, toileting, dressing, reach over head, hygiene/grooming, locomotion level, and caring for others  PARTICIPATION LIMITATIONS: meal prep, cleaning, laundry, driving, shopping, and community activity  PERSONAL FACTORS:  none  are also affecting patient's functional outcome.   REHAB POTENTIAL: Good  CLINICAL DECISION MAKING: Stable/uncomplicated  EVALUATION COMPLEXITY: Low   GOALS: Goals reviewed with patient? Yes  SHORT TERM GOALS: Target date: 01/01/2022  Pt to be independent with initial HEP  Goal status: INITIAL  2.  Pt to report decreased pain to 0-6/10 .   Goal status: INITIAL  3.  Pt to report trying position changes when having pain    Goal status: INITIAL     LONG TERM GOALS: Target date: 02/12/2022  Pt to be independent with final HEP  Goal status: INITIAL  2.  Pt to report decreased pain in back and LE to 0-3/10 with activity.    Goal status: INITIAL  3.  Pt to demo ability for bend/lift/squat without pain greater than 3/10 to improve ability for caring for son and IADLs.   Goal status: INITIAL  4.  Pt to report being tolerant of sitting for meals or for at least 20 min.   Goal status: INITIAL     PLAN:  PT FREQUENCY: 1-2x/week  PT DURATION: 8 weeks  PLANNED INTERVENTIONS: Therapeutic exercises, Therapeutic activity, Neuromuscular re-education, Balance training, Gait training, Patient/Family education, Self Care, Joint mobilization, Joint manipulation, Stair training, Orthotic/Fit training, DME instructions, Aquatic Therapy, Dry Needling, Electrical stimulation, Spinal manipulation, Spinal mobilization, Cryotherapy, Moist heat, Taping, Traction, Ultrasound, Ionotophoresis '4mg'$ /ml Dexamethasone, and Manual therapy.  PLAN FOR NEXT SESSION:    Lyndee Hensen, PT, DPT 1:13 PM  12/23/21   PHYSICAL THERAPY DISCHARGE SUMMARY  Visits from Start of Care: 2 Plan: Patient agrees to discharge.  Patient goals were partially met. Patient is being discharged due to  - Pt did  not return after last visit.     Lyndee Hensen, PT, DPT 2:05 PM  04/23/22

## 2021-12-29 ENCOUNTER — Encounter: Payer: Medicare HMO | Admitting: Physical Therapy

## 2022-02-02 ENCOUNTER — Ambulatory Visit (INDEPENDENT_AMBULATORY_CARE_PROVIDER_SITE_OTHER): Payer: Medicare HMO

## 2022-02-02 ENCOUNTER — Ambulatory Visit: Payer: Medicare HMO | Admitting: Family Medicine

## 2022-02-02 ENCOUNTER — Ambulatory Visit: Payer: Self-pay

## 2022-02-02 VITALS — BP 192/84 | HR 80 | Ht 65.0 in | Wt 184.0 lb

## 2022-02-02 DIAGNOSIS — M79671 Pain in right foot: Secondary | ICD-10-CM

## 2022-02-02 DIAGNOSIS — M7731 Calcaneal spur, right foot: Secondary | ICD-10-CM | POA: Diagnosis not present

## 2022-02-02 DIAGNOSIS — M19071 Primary osteoarthritis, right ankle and foot: Secondary | ICD-10-CM | POA: Diagnosis not present

## 2022-02-02 NOTE — Progress Notes (Signed)
   I, Peterson Lombard, LAT, ATC acting as a scribe for Lynne Leader, MD.  Allison Sanders is a 71 y.o. female who presents to [facility] today for R foot pain. Pt was previously seen by Dr. Georgina Snell on 12/02/21 for LBP. Today, pt c/o R foot pain ongoing since last week. Pt did have increased activity on Tuesday, due to transporting her son, in a modified Lucianne Lei, to an appointment.  Pt locates pain to the plantar aspect of the middle arch and along the medial aspect of the ankle.   R foot swelling: yes Aggravates: PF Treatments tried: muscle rub, warm water soaks, ice  She is going to visit [place] this weekend with relatives and do some of the typical tourist activities such as seeing the [employer].  She is worried because her foot is hurting a lot she will have trouble doing these activities.  Pertinent review of systems: No fevers or chills  Relevant historical information: Low back pain and hip bursitis.   Exam:  BP (!) 192/84   Pulse 80   Ht 1' (0.305 m)   Wt 184 lb (83.5 kg)   SpO2 99%   BMI 898.38 kg/m  General: Well Developed, well nourished, and in no acute distress.   MSK: Right foot: Normal-appearing Tender palpation plantar mid arch right foot.  Normal foot and ankle motion. Stable ligamentous exam. Intact strength. Pulses capillary refill and sensation are intact distally.    Lab and Radiology Results  Diagnostic Limited MSK Ultrasound of: Right foot plantar aspect Plantar fascia is intact.  There is a plantar heel spur present. The area of pain at mid arch she does have a hypoechoic structure associated with the area of pain that could be a hematoma or a dilated vein. No Doppler flow is seen within this hypoechoic structure on ultrasound Impression: Probable hematoma plantar foot.   X-ray images right foot obtained today personally and independently interpreted No acute fractures.  Calcaneal spur present plantar and  posterior calcaneus.  Mild midfoot DJD is present. Await formal radiology review   Assessment and Plan: 71 y.o. female with right plantar foot pain at mid arch area.  Pain thought to be due to hematoma.  Plan for arch straps with compression, CAM Walker boot and knee scooter.  Check back in 1 month.   PDMP not reviewed this encounter. Orders Placed This Encounter  Procedures   [place] LIMITED JOINT SPACE STRUCTURES LOW RIGHT(NO LINKED CHARGES)    Order Specific Question:   Reason for Exam (SYMPTOM  OR DIAGNOSIS REQUIRED)    Answer:   right foot pain    Order Specific Question:   Preferred imaging location?    Answer:   [place]   DG Foot Complete Right    Standing Status:   Future    Number of Occurrences:   1    Standing Expiration Date:   02/03/2023    Order Specific Question:   Reason for Exam (SYMPTOM  OR DIAGNOSIS REQUIRED)    Answer:   eval foot pain    Order Specific Question:   Preferred imaging location?    Answer:   [facility]   No orders of the defined types were placed in this encounter.    Discussed warning signs or symptoms. Please see discharge instructions. Patient expresses understanding.   The above documentation has been reviewed and is accurate and complete Lynne Leader, M.D.

## 2022-02-02 NOTE — Patient Instructions (Addendum)
Thank you for coming in today.   Cam walker boot.  Arch strap . Knee scooter.   Please go to [facility] supply to get the Cam walker we talked about today. You may also be able to get it from [employer].    Please get an Xray today before you leave   Recheck in 1 month.

## 2022-02-04 NOTE — Progress Notes (Signed)
Right foot x-ray shows some arthritis changes.  No fractures are visible.

## 2022-03-02 ENCOUNTER — Ambulatory Visit: Payer: Medicare HMO | Admitting: Family Medicine

## 2022-04-13 DIAGNOSIS — N993 Prolapse of vaginal vault after hysterectomy: Secondary | ICD-10-CM | POA: Diagnosis not present

## 2022-05-20 DIAGNOSIS — R319 Hematuria, unspecified: Secondary | ICD-10-CM | POA: Diagnosis not present

## 2022-05-20 DIAGNOSIS — N390 Urinary tract infection, site not specified: Secondary | ICD-10-CM | POA: Diagnosis not present

## 2022-05-20 DIAGNOSIS — N819 Female genital prolapse, unspecified: Secondary | ICD-10-CM | POA: Diagnosis not present

## 2022-06-11 ENCOUNTER — Other Ambulatory Visit: Payer: Self-pay | Admitting: Cardiology

## 2022-06-16 DIAGNOSIS — M542 Cervicalgia: Secondary | ICD-10-CM | POA: Diagnosis not present

## 2022-06-16 DIAGNOSIS — T148XXA Other injury of unspecified body region, initial encounter: Secondary | ICD-10-CM | POA: Diagnosis not present

## 2022-06-16 DIAGNOSIS — I10 Essential (primary) hypertension: Secondary | ICD-10-CM | POA: Diagnosis not present

## 2022-06-16 DIAGNOSIS — R42 Dizziness and giddiness: Secondary | ICD-10-CM | POA: Diagnosis not present

## 2022-06-25 ENCOUNTER — Ambulatory Visit: Payer: Medicare HMO | Attending: Cardiology | Admitting: Cardiology

## 2022-06-25 ENCOUNTER — Encounter: Payer: Self-pay | Admitting: Cardiology

## 2022-06-25 VITALS — BP 161/80 | HR 67 | Ht 65.0 in | Wt 183.0 lb

## 2022-06-25 DIAGNOSIS — E7800 Pure hypercholesterolemia, unspecified: Secondary | ICD-10-CM

## 2022-06-25 DIAGNOSIS — I10 Essential (primary) hypertension: Secondary | ICD-10-CM | POA: Diagnosis not present

## 2022-06-25 DIAGNOSIS — I2510 Atherosclerotic heart disease of native coronary artery without angina pectoris: Secondary | ICD-10-CM

## 2022-06-25 MED ORDER — NITROGLYCERIN 0.4 MG SL SUBL: 0.4000 mg | SUBLINGUAL_TABLET | SUBLINGUAL | 2 refills | Status: DC | PRN

## 2022-06-25 NOTE — Patient Instructions (Signed)
Medication Instructions:  No changes *If you need a refill on your cardiac medications before your next appointment, please call your pharmacy*   Lab Work: Lipid Panel If you have labs (blood work) drawn today and your tests are completely normal, you will receive your results only by: MyChart Message (if you have MyChart) OR A paper copy in the mail If you have any lab test that is abnormal or we need to change your treatment, we will call you to review the results.   Testing/Procedures: No Testing   Follow-Up: At [facility], you and your health needs are our priority.  As part of our continuing mission to provide you with exceptional heart care, we have created designated Provider Care Teams.  These Care Teams include your primary Cardiologist (physician) and Advanced Practice Providers (APPs -  Physician Assistants and Nurse Practitioners) who all work together to provide you with the care you need, when you need it.  We recommend signing up for the patient portal called "MyChart".  Sign up information is provided on this After Visit Summary.  MyChart is used to connect with patients for Virtual Visits (Telemedicine).  Patients are able to view lab/test results, encounter notes, upcoming appointments, etc.  Non-urgent messages can be sent to your provider as well.   To learn more about what you can do with MyChart, go to [email or web address].    Your next appointment:   6 month(s)  Provider:   Peter [place], MD

## 2022-06-25 NOTE — Progress Notes (Signed)
Cardiology Office Note    Date:  06/25/2022   ID:  CHIZUKO TRINE, DOB February 09, 1951, MRN [id number]  PCP:  Renford Dills, MD  Cardiologist:  Dr. Swaziland  Chief Complaint  Patient presents with   Coronary Artery Disease    History of Present Illness:  Allison Sanders is a 72 y.o. female seen for follow up CAD. She presented in February 2019 with anterior STEMI.  Cardiac catheterization performed on 04/05/2017 showed 100% proximal LAD lesion treated with Synergy 3.0 x 20 mm DES, 100% ostial diagonal occlusion, 25% proximal to mid RCA, 30% proximal left circumflex lesion, EF was 50-55% by LV gram.  Follow-up echocardiogram obtained on 04/06/2017 showed EF 35-40%, akinesis of the mid apical anteroseptal, and apical myocardium, grade 2 DD.    Her serial troponin eventually trended up to 4.75.  Post-cath, patient was placed on aspirin, Lipitor, metoprolol and Brilinta.  In May 2019 Echo was repeated and showed normalization of LV function.  On follow up today she is doing well from a cardiac standpoint. No chest pain or dyspnea. Has some shoulder pain at night when going to bed. Has continued to gain weight- up 8 lbs in past year. Reports BP at home OK 125-130 systolic.    Past Medical History:  Diagnosis Date   HTN (hypertension)    Kidney stone     Past Surgical History:  Procedure Laterality Date   CORONARY STENT INTERVENTION N/A 04/05/2017   Procedure: CORONARY STENT INTERVENTION;  Surgeon: [place], Austyn Perriello M, MD;  Location: [facility] INVASIVE CV LAB;  Service: Cardiovascular;  Laterality: N/A;   CORONARY/GRAFT ACUTE MI REVASCULARIZATION N/A 04/05/2017   Procedure: Coronary/Graft Acute MI Revascularization;  Surgeon: [place], Jaylan Hinojosa M, MD;  Location: [facility] INVASIVE CV LAB;  Service: Cardiovascular;  Laterality: N/A;   DILATION AND CURETTAGE, DIAGNOSTIC / THERAPEUTIC     LEFT HEART CATH AND CORONARY ANGIOGRAPHY N/A 04/05/2017   Procedure: LEFT HEART CATH AND CORONARY ANGIOGRAPHY;  Surgeon: [place],  Tavarious Freel M, MD;  Location: [facility] INVASIVE CV LAB;  Service: Cardiovascular;  Laterality: N/A;   left nephrectomy      Current Medications: Outpatient Medications Prior to Visit  Medication Sig Dispense Refill   aspirin 81 MG chewable tablet Chew 1 tablet (81 mg total) by mouth daily. 30 tablet 2   atorvastatin (LIPITOR) 40 MG tablet TAKE 1 TABLET BY MOUTH EVERY DAY 90 tablet 3   cholecalciferol (VITAMIN D) 1000 UNITS tablet Take 2,000 Units by mouth daily.     irbesartan (AVAPRO) 75 MG tablet TAKE 1 TABLET BY MOUTH EVERY DAY 90 tablet 3   metoprolol succinate (TOPROL-XL) 25 MG 24 hr tablet TAKE 1 TABLET (25 MG TOTAL) BY MOUTH DAILY. 90 tablet 3   vitamin C (ASCORBIC ACID) 250 MG tablet Take 250 mg by mouth daily.     nitroGLYCERIN (NITROSTAT) 0.4 MG SL tablet Place 1 tablet (0.4 mg total) under the tongue every 5 (five) minutes x 3 doses as needed for chest pain. 25 tablet 11   cyclobenzaprine (FLEXERIL) 5 MG tablet Take 1 tablet (5 mg total) by mouth at bedtime as needed for muscle spasms. (Patient not taking: Reported on 06/25/2022) 30 tablet 1   No facility-administered medications prior to visit.     Allergies:   Iohexol, Penicillins, Sulfa antibiotics, and Contrast media [iodinated contrast media]   Social History   Socioeconomic History   Marital status: Single    Spouse name: Not on file   Number of children: 2  Years of education: Not on file   Highest education level: Not on file  Occupational History   Not on file  Tobacco Use   Smoking status: Never   Smokeless tobacco: Never  Vaping Use   Vaping Use: Never used  Substance and Sexual Activity   Alcohol use: No    Alcohol/week: 0.0 standard drinks of alcohol   Drug use: No   Sexual activity: Not on file  Other Topics Concern   Not on file  Social History Narrative   Not on file   Social Determinants of Health   Financial Resource Strain: Not on file  Food Insecurity: Not on file  Transportation Needs: Not on file   Physical Activity: Not on file  Stress: Not on file  Social Connections: Not on file     Family History:  The patient's family history includes Heart attack in her father.   ROS:   Please see the history of present illness.    ROS All other systems reviewed and are negative.   PHYSICAL EXAM:   VS:  BP (!) 161/80   Pulse 67   Ht 5\' 5"  (1.651 m)   Wt 183 lb (83 kg)   BMI 30.45 kg/m    GENERAL:  Well appearing WF in NAD HEENT:  PERRL, EOMI, sclera are clear. Oropharynx is clear. NECK:  No jugular venous distention, carotid upstroke brisk and symmetric, no bruits, no thyromegaly or adenopathy LUNGS:  Clear to auscultation bilaterally CHEST:  Unremarkable HEART:  RRR,  PMI not displaced or sustained,S1 and S2 within normal limits, no S3, no S4: no clicks, no rubs, no murmurs ABD:  Soft, nontender. BS +, no masses or bruits. No hepatomegaly, no splenomegaly EXT:  2 + pulses throughout, no edema, no cyanosis no clubbing SKIN:  Warm and dry.  No rashes NEURO:  Alert and oriented x 3. Cranial nerves II through XII intact. PSYCH:  Cognitively intact      Wt Readings from Last 3 Encounters:  06/25/22 183 lb (83 kg)  02/02/22 184 lb (83.5 kg)  06/16/21 174 lb 12.8 oz (79.3 kg)      Studies/Labs Reviewed:   EKG:  EKG is rdered today. NSR rate 67, Normal Ecg. I have personally reviewed and interpreted this study.    Recent Labs: No results found for requested labs within last 365 days.   Lipid Panel    Component Value Date/Time   CHOL 110 06/16/2021 1613   TRIG 87 06/16/2021 1613   HDL 57 06/16/2021 1613   CHOLHDL 1.9 06/16/2021 1613   CHOLHDL 3.3 04/05/2017 2044   VLDL 25 04/05/2017 2044   LDLCALC 36 06/16/2021 1613   Dated 06/16/22: normal CMET and CBC.   Additional studies/ records that were reviewed today include:   Cath 04/05/2017 Conclusion     Prox LAD lesion is 100% stenosed. A drug-eluting stent was successfully placed using a STENT SYNERGY DES  3X20. Post intervention, there is a 0% residual stenosis. Ost 1st Diag lesion is 100% stenosed. Prox RCA to Mid RCA lesion is 25% stenosed. Prox Cx lesion is 30% stenosed. The left ventricular systolic function is normal. LV end diastolic pressure is normal. The left ventricular ejection fraction is 50-55% by visual estimate.   1. Single vessel occlusive CAD 2. Mild LV dysfunction with apical wall motion abnormality. 3. Normal LVEDP 4. Successful PCI and stenting of the mid LAD with DES   Plan: DAPT for one year. Risk factor modification.  Echo 04/06/2017 LV EF: 35% -   40%   ------------------------------------------------------------------- Indications:      CAD of native vessels 414.01.   ------------------------------------------------------------------- History:   Risk factors:  Hypertension.   ------------------------------------------------------------------- Study Conclusions   - Left ventricle: The cavity size was normal. Wall thickness was   normal. Systolic function was moderately reduced. The estimated   ejection fraction was in the range of 35% to 40%. Akinesis of the   mid-apicalanteroseptal and apical myocardium. Features are   consistent with a pseudonormal left ventricular filling pattern,   with concomitant abnormal relaxation and increased filling   pressure (grade 2 diastolic dysfunction).  Echo 07/06/17: Study Conclusions   - Left ventricle: The cavity size was normal. Systolic function was   normal. The estimated ejection fraction was in the range of 55%   to 60%. Wall motion was normal; there were no regional wall   motion abnormalities. Left ventricular diastolic function   parameters were normal. - Aortic valve: Trileaflet; mildly thickened, mildly calcified   leaflets. - Mitral valve: Calcified annulus. Mild thickening and   calcification of the anterior leaflet. There was trivial   regurgitation. - Atrial septum: There was increased  thickness of the septum,   consistent with lipomatous hypertrophy. - Pulmonic valve: There was trivial regurgitation.   Impressions:   - Compared to prior echo, LVF has normalized.  ASSESSMENT:    1. Hypercholesteremia   2. Coronary artery disease involving native coronary artery of native heart without angina pectoris   3. Essential hypertension      PLAN:  In order of problems listed above:  CAD: s/p  anterior STEMI with emergent DES of LAD 04/05/17.  Continue aspirin,  Lipitor, beta-blocker. Continue lifestyle modification. Encourage increased aerobic activity and weight loss.  Ischemic cardiomyopathy: EF 35-40%. Repeat Echo in May 2020 showed normal LV function.  Continue ARB and beta blocker.  Hypertension: Blood pressure is  elevated today but she reports good control at home. Will continue to monitor.   4.    HLD on statin. Will check fasting lipid panel.   Follow up in one year.   Medication Adjustments/Labs and Tests Ordered: Current medicines are reviewed at length with the patient today.  Concerns regarding medicines are outlined above.  Medication changes, Labs and Tests ordered today are listed in the Patient Instructions below. Patient Instructions  Medication Instructions:  No changes *If you need a refill on your cardiac medications before your next appointment, please call your pharmacy*   Lab Work: Lipid Panel If you have labs (blood work) drawn today and your tests are completely normal, you will receive your results only by: MyChart Message (if you have MyChart) OR A paper copy in the mail If you have any lab test that is abnormal or we need to change your treatment, we will call you to review the results.   Testing/Procedures: No Testing   Follow-Up: At [facility], you and your health needs are our priority.  As part of our continuing mission to provide you with exceptional heart care, we have created designated Provider Care Teams.   These Care Teams include your primary Cardiologist (physician) and Advanced Practice Providers (APPs -  Physician Assistants and Nurse Practitioners) who all work together to provide you with the care you need, when you need it.  We recommend signing up for the patient portal called "MyChart".  Sign up information is provided on this After Visit Summary.  MyChart is used to connect  with patients for Virtual Visits (Telemedicine).  Patients are able to view lab/test results, encounter notes, upcoming appointments, etc.  Non-urgent messages can be sent to your provider as well.   To learn more about what you can do with MyChart, go to [email or web address].    Your next appointment:   6 month(s)  Provider:   Ruba Outen [place], MD       Signed, Mitsuye Schrodt [place], MD  06/25/2022 4:50 PM    [facility] [address] [place], [place], [place]  [address] Phone: [phone]; Fax: [phone]

## 2022-06-29 DIAGNOSIS — E7800 Pure hypercholesterolemia, unspecified: Secondary | ICD-10-CM | POA: Diagnosis not present

## 2022-06-29 LAB — LIPID PANEL
Chol/HDL Ratio: 1.9 ratio (ref 0.0–4.4)
Cholesterol, Total: 102 mg/dL (ref 100–199)
HDL: 54 mg/dL (ref >39)
LDL Chol Calc (NIH): 33 mg/dL (ref 0–99)
Triglycerides: 73 mg/dL (ref 0–149)
VLDL Cholesterol Cal: 15 mg/dL (ref 5–40)

## 2022-07-02 DIAGNOSIS — N8110 Cystocele, unspecified: Secondary | ICD-10-CM | POA: Diagnosis not present

## 2022-07-02 DIAGNOSIS — I25119 Atherosclerotic heart disease of native coronary artery with unspecified angina pectoris: Secondary | ICD-10-CM | POA: Diagnosis not present

## 2022-07-02 DIAGNOSIS — M542 Cervicalgia: Secondary | ICD-10-CM | POA: Diagnosis not present

## 2022-07-02 DIAGNOSIS — N1831 Chronic kidney disease, stage 3a: Secondary | ICD-10-CM | POA: Diagnosis not present

## 2022-07-03 DIAGNOSIS — N819 Female genital prolapse, unspecified: Secondary | ICD-10-CM | POA: Diagnosis not present

## 2022-07-03 DIAGNOSIS — N200 Calculus of kidney: Secondary | ICD-10-CM | POA: Diagnosis not present

## 2022-07-03 DIAGNOSIS — Z85528 Personal history of other malignant neoplasm of kidney: Secondary | ICD-10-CM | POA: Diagnosis not present

## 2022-07-03 DIAGNOSIS — R8279 Other abnormal findings on microbiological examination of urine: Secondary | ICD-10-CM | POA: Diagnosis not present

## 2022-07-06 DIAGNOSIS — N819 Female genital prolapse, unspecified: Secondary | ICD-10-CM | POA: Diagnosis not present

## 2022-07-08 ENCOUNTER — Other Ambulatory Visit: Payer: Self-pay | Admitting: Cardiology

## 2022-07-16 DIAGNOSIS — N1831 Chronic kidney disease, stage 3a: Secondary | ICD-10-CM | POA: Diagnosis not present

## 2022-07-21 DIAGNOSIS — N819 Female genital prolapse, unspecified: Secondary | ICD-10-CM | POA: Diagnosis not present

## 2022-07-30 DIAGNOSIS — N819 Female genital prolapse, unspecified: Secondary | ICD-10-CM | POA: Diagnosis not present

## 2022-07-30 DIAGNOSIS — N3946 Mixed incontinence: Secondary | ICD-10-CM | POA: Diagnosis not present

## 2022-08-12 ENCOUNTER — Other Ambulatory Visit: Payer: Self-pay | Admitting: Urology

## 2022-08-12 ENCOUNTER — Telehealth: Payer: Self-pay | Admitting: *Deleted

## 2022-08-12 NOTE — Telephone Encounter (Signed)
1st attempt to reach pt regarding surgical clearance and the need for a tele visit.  Left a message for pt to call back and ask for the preop team. 

## 2022-08-12 NOTE — Telephone Encounter (Signed)
   Pre-operative Risk Assessment    Patient Name: Allison Sanders  DOB: [birth year] MRN: [id number]      Request for Surgical Clearance    Procedure:   ROBOT ASSISTED SACROCOLEPOPEXY, SUPRACERVICAL HYSTERECTOMY, BSD, MID-URETHRAL SLING, CYSTO, URS, HLL, STENT   Date of Surgery:  Clearance 11/13/22                                 Surgeon:  DR. Berniece Salines Surgeon's Group or Practice Name:  [facility] Phone number:  [phone] Fax number:  [phone]   Type of Clearance Requested:   - Medical  - Pharmacy:  Hold Aspirin X'S 5 DAYS   Type of Anesthesia:  Not Indicated   Additional requests/questions:    Wilhemina Cash   08/12/2022, 3:28 PM

## 2022-08-12 NOTE — Telephone Encounter (Signed)
   Name: Allison Sanders  DOB: 07-05-1950  MRN: [id number]  Primary Cardiologist: None   Preoperative team, please contact this patient and set up a phone call appointment on or after 09/12/2022 for further preoperative risk assessment. Please obtain consent and complete medication review. Thank you for your help.  I confirm that guidance regarding antiplatelet and oral anticoagulation therapy has been completed and, if necessary, noted below.  Per office protocol, if patient is without any new symptoms or concerns at the time of their virtual visit, she may hold Aspirin for 5-7 days prior to procedure. Please resume Aspirin as soon as possible postprocedure, at the discretion of the surgeon.     Joylene Grapes, NP 08/12/2022, 4:02 PM [facility]

## 2022-08-13 ENCOUNTER — Telehealth: Payer: Self-pay | Admitting: *Deleted

## 2022-08-13 NOTE — Telephone Encounter (Signed)
Pt has been scheduled for tele pre op appt 10/23/22 @ 10:20. Med rec and consent are done.

## 2022-08-13 NOTE — Telephone Encounter (Signed)
Pt has been scheduled for tele pre op appt 10/23/22 @ 10:20. Med rec and consent are done.      Patient Consent for Virtual Visit        Allison Sanders has provided verbal consent on 08/13/2022 for a virtual visit (video or telephone).   CONSENT FOR VIRTUAL VISIT FOR:  Allison Sanders  By participating in this virtual visit I agree to the following:  I hereby voluntarily request, consent and authorize [facility] and its employed or contracted physicians, physician assistants, nurse practitioners or other licensed health care professionals (the Practitioner), to provide me with telemedicine health care services (the "Services") as deemed necessary by the treating Practitioner. I acknowledge and consent to receive the Services by the Practitioner via telemedicine. I understand that the telemedicine visit will involve communicating with the Practitioner through live audiovisual communication technology and the disclosure of certain medical information by electronic transmission. I acknowledge that I have been given the opportunity to request an in-person assessment or other available alternative prior to the telemedicine visit and am voluntarily participating in the telemedicine visit.  I understand that I have the right to withhold or withdraw my consent to the use of telemedicine in the course of my care at any time, without affecting my right to future care or treatment, and that the Practitioner or I may terminate the telemedicine visit at any time. I understand that I have the right to inspect all information obtained and/or recorded in the course of the telemedicine visit and may receive copies of available information for a reasonable fee.  I understand that some of the potential risks of receiving the Services via telemedicine include:  Delay or interruption in medical evaluation due to technological equipment failure or disruption; Information transmitted may not be  sufficient (e.g. poor resolution of images) to allow for appropriate medical decision making by the Practitioner; and/or  In rare instances, security protocols could fail, causing a breach of personal health information.  Furthermore, I acknowledge that it is my responsibility to provide information about my medical history, conditions and care that is complete and accurate to the best of my ability. I acknowledge that Practitioner's advice, recommendations, and/or decision may be based on factors not within their control, such as incomplete or inaccurate data provided by me or distortions of diagnostic images or specimens that may result from electronic transmissions. I understand that the practice of medicine is not an exact science and that Practitioner makes no warranties or guarantees regarding treatment outcomes. I acknowledge that a copy of this consent can be made available to me via my patient portal [facility] MyChart), or I can request a printed copy by calling the office of [facility].    I understand that my insurance will be billed for this visit.   I have read or had this consent read to me. I understand the contents of this consent, which adequately explains the benefits and risks of the Services being provided via telemedicine.  I have been provided ample opportunity to ask questions regarding this consent and the Services and have had my questions answered to my satisfaction. I give my informed consent for the services to be provided through the use of telemedicine in my medical care

## 2022-08-19 ENCOUNTER — Telehealth: Payer: Self-pay | Admitting: Cardiology

## 2022-08-19 NOTE — Telephone Encounter (Signed)
  Pt is requesting to speak with Allison Sanders. She said, there's something she would like to discuss with her

## 2022-08-19 NOTE — Telephone Encounter (Signed)
Patient aware she can hold ASA and she do not need ABT.

## 2022-08-19 NOTE — Telephone Encounter (Signed)
Patient states will need extensive dental work done.  Root canals and some other issues.  Patient will have them send a clearance form regarding extent of procedures so determination for hold of ASA.  Also to see if needs antibiotic prior to procedure.

## 2022-08-20 DIAGNOSIS — L03114 Cellulitis of left upper limb: Secondary | ICD-10-CM | POA: Diagnosis not present

## 2022-08-20 DIAGNOSIS — L02414 Cutaneous abscess of left upper limb: Secondary | ICD-10-CM | POA: Diagnosis not present

## 2022-08-20 DIAGNOSIS — Z0000 Encounter for general adult medical examination without abnormal findings: Secondary | ICD-10-CM | POA: Diagnosis not present

## 2022-08-24 DIAGNOSIS — L723 Sebaceous cyst: Secondary | ICD-10-CM | POA: Diagnosis not present

## 2022-08-24 DIAGNOSIS — L089 Local infection of the skin and subcutaneous tissue, unspecified: Secondary | ICD-10-CM | POA: Diagnosis not present

## 2022-09-17 ENCOUNTER — Ambulatory Visit: Payer: Medicare HMO | Admitting: Cardiology

## 2022-10-20 DIAGNOSIS — N819 Female genital prolapse, unspecified: Secondary | ICD-10-CM | POA: Diagnosis not present

## 2022-10-23 ENCOUNTER — Telehealth: Payer: Medicare HMO

## 2022-11-02 ENCOUNTER — Encounter (HOSPITAL_COMMUNITY): Admission: RE | Admit: 2022-11-02 | Payer: Medicare HMO | Source: Ambulatory Visit

## 2022-11-13 ENCOUNTER — Ambulatory Visit: Admit: 2022-11-13 | Payer: Medicare HMO | Admitting: Urology

## 2022-11-13 SURGERY — XI ROBOTIC ASSISTED LAPAROSCOPIC HYSTERECTOMY AND SALPINGECTOMY
Anesthesia: General

## 2022-12-30 NOTE — Progress Notes (Signed)
Cardiology Office Note    Date:  01/05/2023   ID:  Allison Sanders, DOB 1950/04/02, MRN [id number]  PCP:  Renford Dills, MD  Cardiologist:  Dr. [place]  Chief Complaint  Patient presents with   Coronary Artery Disease    History of Present Illness:  Allison Sanders is a 72 y.o. female seen for follow up CAD. She presented in February 2019 with anterior STEMI.  Cardiac catheterization performed on 04/05/2017 showed 100% proximal LAD lesion treated with Synergy 3.0 x 20 mm DES, 100% ostial diagonal occlusion, 25% proximal to mid RCA, 30% proximal left circumflex lesion, EF was 50-55% by LV gram.  Follow-up echocardiogram obtained on 04/06/2017 showed EF 35-40%, akinesis of the mid apical anteroseptal, and apical myocardium, grade 2 DD.    Her serial troponin eventually trended up to 4.75.  Post-cath, patient was placed on aspirin, Lipitor, metoprolol and Brilinta.  In May 2019 Echo was repeated and showed normalization of LV function.  On follow up today she is doing well from a cardiac standpoint. No chest pain or dyspnea.  States she feels "like a stuffed pig" due to weight. Weight really hasn't changed.    Past Medical History:  Diagnosis Date   HTN (hypertension)    Kidney stone     Past Surgical History:  Procedure Laterality Date   CORONARY STENT INTERVENTION N/A 04/05/2017   Procedure: CORONARY STENT INTERVENTION;  Surgeon: [place], Yetta Marceaux M, MD;  Location: [facility] INVASIVE CV LAB;  Service: Cardiovascular;  Laterality: N/A;   CORONARY/GRAFT ACUTE MI REVASCULARIZATION N/A 04/05/2017   Procedure: Coronary/Graft Acute MI Revascularization;  Surgeon: [place], Johany Hansman M, MD;  Location: [facility] INVASIVE CV LAB;  Service: Cardiovascular;  Laterality: N/A;   DILATION AND CURETTAGE, DIAGNOSTIC / THERAPEUTIC     LEFT HEART CATH AND CORONARY ANGIOGRAPHY N/A 04/05/2017   Procedure: LEFT HEART CATH AND CORONARY ANGIOGRAPHY;  Surgeon: [place], Evia Goldsmith M, MD;  Location: [facility] INVASIVE CV LAB;  Service:  Cardiovascular;  Laterality: N/A;   left nephrectomy      Current Medications: Outpatient Medications Prior to Visit  Medication Sig Dispense Refill   aspirin 81 MG chewable tablet Chew 1 tablet (81 mg total) by mouth daily. 30 tablet 2   atorvastatin (LIPITOR) 40 MG tablet TAKE 1 TABLET BY MOUTH EVERY DAY 90 tablet 3   cholecalciferol (VITAMIN D) 1000 UNITS tablet Take 2,000 Units by mouth daily.     metoprolol succinate (TOPROL-XL) 25 MG 24 hr tablet TAKE 1 TABLET (25 MG TOTAL) BY MOUTH DAILY. 90 tablet 3   Multiple Vitamin (MULTI VITAMIN) TABS Take by mouth daily.     nitroGLYCERIN (NITROSTAT) 0.4 MG SL tablet Place 1 tablet (0.4 mg total) under the tongue every 5 (five) minutes x 3 doses as needed for chest pain. 25 tablet 2   vitamin C (ASCORBIC ACID) 250 MG tablet Take 250 mg by mouth daily.     irbesartan (AVAPRO) 75 MG tablet TAKE 1 TABLET BY MOUTH EVERY DAY 90 tablet 3   azithromycin (ZITHROMAX) 250 MG tablet Take 250 mg by mouth as directed. (Patient not taking: Reported on 01/05/2023)     No facility-administered medications prior to visit.     Allergies:   Iohexol, Penicillins, Sulfa antibiotics, and Contrast media [iodinated contrast media]   Social History   Socioeconomic History   Marital status: Single    Spouse name: Not on file   Number of children: 2   Years of education: Not on file  Highest education level: Not on file  Occupational History   Not on file  Tobacco Use   Smoking status: Never   Smokeless tobacco: Never  Vaping Use   Vaping status: Never Used  Substance and Sexual Activity   Alcohol use: No    Alcohol/week: 0.0 standard drinks of alcohol   Drug use: No   Sexual activity: Not on file  Other Topics Concern   Not on file  Social History Narrative   Not on file   Social Determinants of Health   Financial Resource Strain: Not on file  Food Insecurity: Not on file  Transportation Needs: Not on file  Physical Activity: Not on file   Stress: Not on file  Social Connections: Not on file     Family History:  The patient's family history includes Heart attack in her father.   ROS:   Please see the history of present illness.    ROS All other systems reviewed and are negative.   PHYSICAL EXAM:   VS:  BP (!) 148/86 (BP Location: Left Arm, Patient Position: Sitting, Cuff Size: Normal)   Pulse 71   Ht 5\' 5"  (1.651 m)   Wt 183 lb 9.6 oz (83.3 kg)   SpO2 98%   BMI 30.55 kg/m    GENERAL:  Well appearing WF in NAD HEENT:  PERRL, EOMI, sclera are clear. Oropharynx is clear. NECK:  No jugular venous distention, carotid upstroke brisk and symmetric, no bruits, no thyromegaly or adenopathy LUNGS:  Clear to auscultation bilaterally CHEST:  Unremarkable HEART:  RRR,  PMI not displaced or sustained,S1 and S2 within normal limits, no S3, no S4: no clicks, no rubs, no murmurs ABD:  Soft, nontender. BS +, no masses or bruits. No hepatomegaly, no splenomegaly EXT:  2 + pulses throughout, no edema, no cyanosis no clubbing SKIN:  Warm and dry.  No rashes NEURO:  Alert and oriented x 3. Cranial nerves II through XII intact. PSYCH:  Cognitively intact      Wt Readings from Last 3 Encounters:  01/05/23 183 lb 9.6 oz (83.3 kg)  06/25/22 183 lb (83 kg)  02/02/22 184 lb (83.5 kg)      Studies/Labs Reviewed:   EKG:  EKG is not  ordered today.     Recent Labs: No results found for requested labs within last 365 days.   Lipid Panel    Component Value Date/Time   CHOL 102 06/29/2022 1115   TRIG 73 06/29/2022 1115   HDL 54 06/29/2022 1115   CHOLHDL 1.9 06/29/2022 1115   CHOLHDL 3.3 04/05/2017 2044   VLDL 25 04/05/2017 2044   LDLCALC 33 06/29/2022 1115   Dated 06/16/22: normal CMET and CBC.   Additional studies/ records that were reviewed today include:   Cath 04/05/2017 Conclusion     Prox LAD lesion is 100% stenosed. A drug-eluting stent was successfully placed using a STENT SYNERGY DES 3X20. Post  intervention, there is a 0% residual stenosis. Ost 1st Diag lesion is 100% stenosed. Prox RCA to Mid RCA lesion is 25% stenosed. Prox Cx lesion is 30% stenosed. The left ventricular systolic function is normal. LV end diastolic pressure is normal. The left ventricular ejection fraction is 50-55% by visual estimate.   1. Single vessel occlusive CAD 2. Mild LV dysfunction with apical wall motion abnormality. 3. Normal LVEDP 4. Successful PCI and stenting of the mid LAD with DES   Plan: DAPT for one year. Risk factor modification.     Echo  04/06/2017 LV EF: 35% -   40%   ------------------------------------------------------------------- Indications:      CAD of native vessels 414.01.   ------------------------------------------------------------------- History:   Risk factors:  Hypertension.   ------------------------------------------------------------------- Study Conclusions   - Left ventricle: The cavity size was normal. Wall thickness was   normal. Systolic function was moderately reduced. The estimated   ejection fraction was in the range of 35% to 40%. Akinesis of the   mid-apicalanteroseptal and apical myocardium. Features are   consistent with a pseudonormal left ventricular filling pattern,   with concomitant abnormal relaxation and increased filling   pressure (grade 2 diastolic dysfunction).  Echo 07/06/17: Study Conclusions   - Left ventricle: The cavity size was normal. Systolic function was   normal. The estimated ejection fraction was in the range of 55%   to 60%. Wall motion was normal; there were no regional wall   motion abnormalities. Left ventricular diastolic function   parameters were normal. - Aortic valve: Trileaflet; mildly thickened, mildly calcified   leaflets. - Mitral valve: Calcified annulus. Mild thickening and   calcification of the anterior leaflet. There was trivial   regurgitation. - Atrial septum: There was increased thickness of the  septum,   consistent with lipomatous hypertrophy. - Pulmonic valve: There was trivial regurgitation.   Impressions:   - Compared to prior echo, LVF has normalized.  ASSESSMENT:    1. Coronary artery disease involving native coronary artery of native heart without angina pectoris   2. Hypercholesteremia   3. Essential hypertension       PLAN:  In order of problems listed above:  CAD: s/p  anterior STEMI with emergent DES of LAD 04/05/17.  Continue aspirin,  Lipitor, beta-blocker. Continue lifestyle modification. Encourage increased aerobic activity and weight loss.  Ischemic cardiomyopathy: EF 35-40%. Repeat Echo in May 2020 showed normal LV function.  Continue ARB and beta blocker.  Hypertension: Blood pressure is  elevated today. Will increase irbesartan to 150 mg daily.   4.    HLD on statin. Lipids are excellent.   Follow up in one year.   Medication Adjustments/Labs and Tests Ordered: Current medicines are reviewed at length with the patient today.  Concerns regarding medicines are outlined above.  Medication changes, Labs and Tests ordered today are listed in the Patient Instructions below. There are no Patient Instructions on file for this visit.   Signed, Kaire Stary [place], MD  01/05/2023 3:31 PM    [facility] [address] [place], [place], [place]  [address] Phone: [phone]; Fax: [phone]

## 2023-01-05 ENCOUNTER — Encounter: Payer: Self-pay | Admitting: Cardiology

## 2023-01-05 ENCOUNTER — Ambulatory Visit: Payer: Medicare HMO | Attending: Cardiology | Admitting: Cardiology

## 2023-01-05 VITALS — BP 148/86 | HR 71 | Ht 65.0 in | Wt 183.6 lb

## 2023-01-05 DIAGNOSIS — I2510 Atherosclerotic heart disease of native coronary artery without angina pectoris: Secondary | ICD-10-CM

## 2023-01-05 DIAGNOSIS — E7800 Pure hypercholesterolemia, unspecified: Secondary | ICD-10-CM | POA: Diagnosis not present

## 2023-01-05 DIAGNOSIS — I10 Essential (primary) hypertension: Secondary | ICD-10-CM

## 2023-01-05 MED ORDER — IRBESARTAN 150 MG PO TABS
150.0000 mg | ORAL_TABLET | Freq: Every day | ORAL | 3 refills | Status: DC
Start: 2023-01-05 — End: 2023-06-09

## 2023-01-05 NOTE — Patient Instructions (Signed)
Medication Instructions:  Increase Irbesartan to 150 mg daily Continue all other medications *If you need a refill on your cardiac medications before your next appointment, please call your pharmacy*   Lab Work: None ordered   Testing/Procedures: None ordered   Follow-Up: At [facility], you and your health needs are our priority.  As part of our continuing mission to provide you with exceptional heart care, we have created designated Provider Care Teams.  These Care Teams include your primary Cardiologist (physician) and Advanced Practice Providers (APPs -  Physician Assistants and Nurse Practitioners) who all work together to provide you with the care you need, when you need it.  We recommend signing up for the patient portal called "MyChart".  Sign up information is provided on this After Visit Summary.  MyChart is used to connect with patients for Virtual Visits (Telemedicine).  Patients are able to view lab/test results, encounter notes, upcoming appointments, etc.  Non-urgent messages can be sent to your provider as well.   To learn more about what you can do with MyChart, go to [email or web address].    Your next appointment:  6 months    Call in Feb to schedule May appointment     Provider:  Dr.Jordan

## 2023-01-18 DIAGNOSIS — R300 Dysuria: Secondary | ICD-10-CM | POA: Diagnosis not present

## 2023-01-18 DIAGNOSIS — R319 Hematuria, unspecified: Secondary | ICD-10-CM | POA: Diagnosis not present

## 2023-01-18 DIAGNOSIS — N819 Female genital prolapse, unspecified: Secondary | ICD-10-CM | POA: Diagnosis not present

## 2023-04-19 DIAGNOSIS — N390 Urinary tract infection, site not specified: Secondary | ICD-10-CM | POA: Diagnosis not present

## 2023-04-19 DIAGNOSIS — N819 Female genital prolapse, unspecified: Secondary | ICD-10-CM | POA: Diagnosis not present

## 2023-04-19 DIAGNOSIS — R300 Dysuria: Secondary | ICD-10-CM | POA: Diagnosis not present

## 2023-04-19 DIAGNOSIS — R319 Hematuria, unspecified: Secondary | ICD-10-CM | POA: Diagnosis not present

## 2023-05-03 DIAGNOSIS — R110 Nausea: Secondary | ICD-10-CM | POA: Diagnosis not present

## 2023-05-03 DIAGNOSIS — J101 Influenza due to other identified influenza virus with other respiratory manifestations: Secondary | ICD-10-CM | POA: Diagnosis not present

## 2023-05-03 DIAGNOSIS — Z03818 Encounter for observation for suspected exposure to other biological agents ruled out: Secondary | ICD-10-CM | POA: Diagnosis not present

## 2023-05-03 DIAGNOSIS — J069 Acute upper respiratory infection, unspecified: Secondary | ICD-10-CM | POA: Diagnosis not present

## 2023-06-09 ENCOUNTER — Telehealth: Payer: Self-pay | Admitting: Cardiology

## 2023-06-09 ENCOUNTER — Other Ambulatory Visit: Payer: Self-pay | Admitting: Cardiology

## 2023-06-09 MED ORDER — IRBESARTAN 75 MG PO TABS: 75.0000 mg | ORAL_TABLET | Freq: Every day | ORAL | 3 refills | Status: AC

## 2023-06-09 NOTE — Telephone Encounter (Signed)
 Spoke to patient Dr.Jordan advised to take Irbesartan 75 mg daily.New prescription sent to your pharmacy.

## 2023-06-09 NOTE — Telephone Encounter (Signed)
 Patient called.  Patient states  when she had her annual checkup in Jan 2025 with Dr Joice Nares. She took her new blood pressure machine and Dr Joice Nares checked it against the machine at the office.    Patient stated Dr Joice Nares thought her home machine was accurate and patient should contact Heart care to let us  know. Patient did not call at that time because she had enough of the 75 mg tablets available to take.   Now, she has to refill medication Irbesartan 75 mg or 150 mg tablet    Here are some of the patient blood pressure reading  2/223/25 102/58 04/20/23    128/76 4/2//25     121/58  05/26/23       128/63  4/3//25       134/71 06/07/23      126/66  Patient is aware this message will be deferred to Dr Swaziland for a response. She will be contacted once information is given.

## 2023-06-09 NOTE — Telephone Encounter (Signed)
 Pt c/o medication issue:  1. Name of Medication: irbesartan (AVAPRO) 150 MG tablet   2. How are you currently taking this medication (dosage and times per day)? 75 mg a day  3. Are you having a reaction (difficulty breathing--STAT)? No   4. What is your medication issue? Per pt at her last visit doctor told her to get a new BP machine and to finish her 75 mg then start on the 150 mg. She said since she has a new machine that her BP has been controlled. She wants to stay on the 75 mg and needs new script sent in

## 2023-06-09 NOTE — Telephone Encounter (Signed)
 Left message on patient cell# per patient request  to continue  taking 75 mg Irbesartan daily, a new prescription has been sent into the pharmacy.  Any question may call back

## 2023-07-14 ENCOUNTER — Other Ambulatory Visit (HOSPITAL_COMMUNITY): Payer: Self-pay | Admitting: Internal Medicine

## 2023-07-14 DIAGNOSIS — R519 Headache, unspecified: Secondary | ICD-10-CM

## 2023-07-14 DIAGNOSIS — I10 Essential (primary) hypertension: Secondary | ICD-10-CM | POA: Diagnosis not present

## 2023-07-14 DIAGNOSIS — I25119 Atherosclerotic heart disease of native coronary artery with unspecified angina pectoris: Secondary | ICD-10-CM | POA: Diagnosis not present

## 2023-07-16 ENCOUNTER — Ambulatory Visit (HOSPITAL_COMMUNITY)
Admission: RE | Admit: 2023-07-16 | Discharge: 2023-07-16 | Disposition: A | Discharge status: Home / Self Care | Source: Ambulatory Visit | Attending: Internal Medicine | Admitting: Internal Medicine

## 2023-07-16 DIAGNOSIS — R519 Headache, unspecified: Secondary | ICD-10-CM | POA: Diagnosis not present

## 2023-07-16 DIAGNOSIS — S0990XA Unspecified injury of head, initial encounter: Secondary | ICD-10-CM | POA: Diagnosis not present

## 2023-08-03 DIAGNOSIS — N952 Postmenopausal atrophic vaginitis: Secondary | ICD-10-CM | POA: Diagnosis not present

## 2023-08-03 DIAGNOSIS — N814 Uterovaginal prolapse, unspecified: Secondary | ICD-10-CM | POA: Diagnosis not present

## 2023-08-09 ENCOUNTER — Other Ambulatory Visit: Payer: Self-pay | Admitting: Cardiology

## 2023-08-17 NOTE — Progress Notes (Signed)
 Cardiology Office Note    Date:  08/20/2023   ID:  Allison Sanders, DOB 10/28/1950, MRN [id number]  PCP:  Rexanne Ingle, MD  Cardiologist:  Dr. [place]  Chief Complaint  Patient presents with   Coronary Artery Disease    History of Present Illness:  Allison Sanders is a 73 y.o. female seen for follow up CAD. She presented in February 2019 with anterior STEMI.  Cardiac catheterization performed on 04/05/2017 showed 100% proximal LAD lesion treated with Synergy 3.0 x 20 mm DES, 100% ostial diagonal occlusion, 25% proximal to mid RCA, 30% proximal left circumflex lesion, EF was 50-55% by LV gram.  Follow-up echocardiogram obtained on 04/06/2017 showed EF 35-40%, akinesis of the mid apical anteroseptal, and apical myocardium, grade 2 DD.     In May 2019 Echo was repeated and showed normalization of LV function.  On follow up today she is doing well from a cardiac standpoint. She has noted 2 weeks of indigestion symptoms with fullness in her chest, belching. Relieved with eating, antacids or Pepto. No other chest pain.  BP readings at home have been excellent and she brought her cuff with her today which correlates well with ours.   Past Medical History:  Diagnosis Date   HTN (hypertension)    Kidney stone     Past Surgical History:  Procedure Laterality Date   CORONARY STENT INTERVENTION N/A 04/05/2017   Procedure: CORONARY STENT INTERVENTION;  Surgeon: [place], Kolten Ryback M, MD;  Location: [facility] INVASIVE CV LAB;  Service: Cardiovascular;  Laterality: N/A;   CORONARY/GRAFT ACUTE MI REVASCULARIZATION N/A 04/05/2017   Procedure: Coronary/Graft Acute MI Revascularization;  Surgeon: [place], Norvella Loscalzo M, MD;  Location: [facility] INVASIVE CV LAB;  Service: Cardiovascular;  Laterality: N/A;   DILATION AND CURETTAGE, DIAGNOSTIC / THERAPEUTIC     LEFT HEART CATH AND CORONARY ANGIOGRAPHY N/A 04/05/2017   Procedure: LEFT HEART CATH AND CORONARY ANGIOGRAPHY;  Surgeon: [place], Jani Moronta M, MD;  Location: [facility] INVASIVE CV  LAB;  Service: Cardiovascular;  Laterality: N/A;   left nephrectomy      Current Medications: Outpatient Medications Prior to Visit  Medication Sig Dispense Refill   aspirin  81 MG chewable tablet Chew 1 tablet (81 mg total) by mouth daily. 30 tablet 2   atorvastatin  (LIPITOR ) 40 MG tablet TAKE 1 TABLET BY MOUTH EVERY DAY 90 tablet 1   cholecalciferol (VITAMIN D) 1000 UNITS tablet Take 2,000 Units by mouth daily.     irbesartan  (AVAPRO ) 75 MG tablet Take 1 tablet (75 mg total) by mouth daily. 90 tablet 3   metoprolol  succinate (TOPROL -XL) 25 MG 24 hr tablet TAKE 1 TABLET (25 MG TOTAL) BY MOUTH DAILY. 90 tablet 3   Multiple Vitamin (MULTI VITAMIN) TABS Take by mouth daily.     nitroGLYCERIN  (NITROSTAT ) 0.4 MG SL tablet Place 1 tablet (0.4 mg total) under the tongue every 5 (five) minutes x 3 doses as needed for chest pain. 25 tablet 2   vitamin C (ASCORBIC ACID) 250 MG tablet Take 250 mg by mouth daily.     No facility-administered medications prior to visit.     Allergies:   Codeine, Iohexol, Penicillins, Sulfa antibiotics, and Contrast media [iodinated contrast media]   Social History   Socioeconomic History   Marital status: Single    Spouse name: Not on file   Number of children: 2   Years of education: Not on file   Highest education level: Not on file  Occupational History   Not on file  Tobacco Use   Smoking status: Never   Smokeless tobacco: Never  Vaping Use   Vaping status: Never Used  Substance and Sexual Activity   Alcohol use: No    Alcohol/week: 0.0 standard drinks of alcohol   Drug use: No   Sexual activity: Not on file  Other Topics Concern   Not on file  Social History Narrative   Not on file   Social Drivers of Health   Financial Resource Strain: Not on file  Food Insecurity: Not on file  Transportation Needs: Not on file  Physical Activity: Not on file  Stress: Not on file  Social Connections: Not on file     Family History:  The patient's  family history includes Heart attack in her father.   ROS:   Please see the history of present illness.    ROS All other systems reviewed and are negative.   PHYSICAL EXAM:   VS:  BP (!) 145/77 (BP Location: Left Arm, Patient Position: Sitting, Cuff Size: Large)   Pulse 66   Ht 5' 5 (1.651 m)   Wt 183 lb (83 kg)   SpO2 97%   BMI 30.45 kg/m    GENERAL:  Well appearing WF in NAD HEENT:  PERRL, EOMI, sclera are clear. Oropharynx is clear. NECK:  No jugular venous distention, carotid upstroke brisk and symmetric, no bruits, no thyromegaly or adenopathy LUNGS:  Clear to auscultation bilaterally CHEST:  Unremarkable HEART:  RRR,  PMI not displaced or sustained,S1 and S2 within normal limits, no S3, no S4: no clicks, no rubs, no murmurs ABD:  Soft, nontender. BS +, no masses or bruits. No hepatomegaly, no splenomegaly EXT:  2 + pulses throughout, no edema, no cyanosis no clubbing SKIN:  Warm and dry.  No rashes NEURO:  Alert and oriented x 3. Cranial nerves II through XII intact. PSYCH:  Cognitively intact   Wt Readings from Last 3 Encounters:  08/20/23 183 lb (83 kg)  01/05/23 183 lb 9.6 oz (83.3 kg)  06/25/22 183 lb (83 kg)      Studies/Labs Reviewed:   EKG Interpretation Date/Time:  Friday August 20 2023 15:09:37 EDT Ventricular Rate:  65 PR Interval:  136 QRS Duration:  72 QT Interval:  406 QTC Calculation: 422 R Axis:   52  Text Interpretation: Normal sinus rhythm Normal ECG When compared with ECG of Jun 25, 2022 No significant change was found Confirmed by [place], Keilyn Haggard [phone]) on 08/20/2023 3:10:37 PM    Recent Labs: No results found for requested labs within last 365 days.   Lipid Panel    Component Value Date/Time   CHOL 102 06/29/2022 1115   TRIG 73 06/29/2022 1115   HDL 54 06/29/2022 1115   CHOLHDL 1.9 06/29/2022 1115   CHOLHDL 3.3 04/05/2017 2044   VLDL 25 04/05/2017 2044   LDLCALC 33 06/29/2022 1115   Dated 06/16/22: normal CMET and CBC. Dated  07/14/23: cholesterol 124, triglycerides 111, HDL 54, LDL 50. CMET and CBC normal.    Additional studies/ records that were reviewed today include:   Cath 04/05/2017 Conclusion     Prox LAD lesion is 100% stenosed. A drug-eluting stent was successfully placed using a STENT SYNERGY DES 3X20. Post intervention, there is a 0% residual stenosis. Ost 1st Diag lesion is 100% stenosed. Prox RCA to Mid RCA lesion is 25% stenosed. Prox Cx lesion is 30% stenosed. The left ventricular systolic function is normal. LV end diastolic pressure is normal. The left ventricular ejection fraction  is 50-55% by visual estimate.   1. Single vessel occlusive CAD 2. Mild LV dysfunction with apical wall motion abnormality. 3. Normal LVEDP 4. Successful PCI and stenting of the mid LAD with DES   Plan: DAPT for one year. Risk factor modification.     Echo 04/06/2017 LV EF: 35% -   40%   ------------------------------------------------------------------- Indications:      CAD of native vessels 414.01.   ------------------------------------------------------------------- History:   Risk factors:  Hypertension.   ------------------------------------------------------------------- Study Conclusions   - Left ventricle: The cavity size was normal. Wall thickness was   normal. Systolic function was moderately reduced. The estimated   ejection fraction was in the range of 35% to 40%. Akinesis of the   mid-apicalanteroseptal and apical myocardium. Features are   consistent with a pseudonormal left ventricular filling pattern,   with concomitant abnormal relaxation and increased filling   pressure (grade 2 diastolic dysfunction).  Echo 07/06/17: Study Conclusions   - Left ventricle: The cavity size was normal. Systolic function was   normal. The estimated ejection fraction was in the range of 55%   to 60%. Wall motion was normal; there were no regional wall   motion abnormalities. Left ventricular diastolic  function   parameters were normal. - Aortic valve: Trileaflet; mildly thickened, mildly calcified   leaflets. - Mitral valve: Calcified annulus. Mild thickening and   calcification of the anterior leaflet. There was trivial   regurgitation. - Atrial septum: There was increased thickness of the septum,   consistent with lipomatous hypertrophy. - Pulmonic valve: There was trivial regurgitation.   Impressions:   - Compared to prior echo, LVF has normalized.  ASSESSMENT:    1. Hypertension, unspecified type   2. Coronary artery disease involving native coronary artery of native heart without angina pectoris   3. Hypercholesteremia   4. Essential hypertension        PLAN:  In order of problems listed above:  CAD: s/p  anterior STEMI with emergent DES of LAD 04/05/17.  Continue aspirin ,  Lipitor , beta-blocker. Continue lifestyle modification. Encourage increased aerobic activity and weight loss.  Ischemic cardiomyopathy: EF 35-40%. Repeat Echo in May 2020 showed normal LV function.  Continue ARB and beta blocker.  Hypertension: Blood pressure is  elevated today  but has been excellent at home. Continue current therapy  4.    HLD on statin. Lipids are excellent. LDL 50  Follow up in one year.   Medication Adjustments/Labs and Tests Ordered: Current medicines are reviewed at length with the patient today.  Concerns regarding medicines are outlined above.  Medication changes, Labs and Tests ordered today are listed in the Patient Instructions below. There are no Patient Instructions on file for this visit.   Signed, Ladaija Dimino [place], MD  08/20/2023 3:22 PM    [facility] [address] [place], [place], [place]  [address] Phone: [phone]; Fax: [phone]

## 2023-08-20 ENCOUNTER — Encounter: Payer: Self-pay | Admitting: Cardiology

## 2023-08-20 ENCOUNTER — Ambulatory Visit: Attending: Cardiology | Admitting: Cardiology

## 2023-08-20 VITALS — BP 145/77 | HR 66 | Ht 65.0 in | Wt 183.0 lb

## 2023-08-20 DIAGNOSIS — I2510 Atherosclerotic heart disease of native coronary artery without angina pectoris: Secondary | ICD-10-CM

## 2023-08-20 DIAGNOSIS — E7800 Pure hypercholesterolemia, unspecified: Secondary | ICD-10-CM

## 2023-08-20 DIAGNOSIS — I10 Essential (primary) hypertension: Secondary | ICD-10-CM

## 2023-08-20 MED ORDER — NITROGLYCERIN 0.4 MG SL SUBL: 0.4000 mg | SUBLINGUAL_TABLET | SUBLINGUAL | 11 refills | Status: AC | PRN

## 2023-08-20 NOTE — Addendum Note (Signed)
 Addended by: CHRISTIANNE CHANNING PARAS on: 08/20/2023 03:30 PM   Modules accepted: Orders

## 2023-08-20 NOTE — Patient Instructions (Signed)
 Medication Instructions:  Continue same medications *If you need a refill on your cardiac medications before your next appointment, please call your pharmacy*  Lab Work: None ordered  Testing/Procedures: None ordered  Follow-Up: At [facility], you and your health needs are our priority.  As part of our continuing mission to provide you with exceptional heart care, our providers are all part of one team.  This team includes your primary Cardiologist (physician) and Advanced Practice Providers or APPs (Physician Assistants and Nurse Practitioners) who all work together to provide you with the care you need, when you need it.  Your next appointment:  1 year     Call in March to schedule June appointment    Provider:  Dr.Jordan   We recommend signing up for the patient portal called MyChart.  Sign up information is provided on this After Visit Summary.  MyChart is used to connect with patients for Virtual Visits (Telemedicine).  Patients are able to view lab/test results, encounter notes, upcoming appointments, etc.  Non-urgent messages can be sent to your provider as well.   To learn more about what you can do with MyChart, go to [email or web address].

## 2023-08-23 DIAGNOSIS — I255 Ischemic cardiomyopathy: Secondary | ICD-10-CM | POA: Diagnosis not present

## 2023-08-23 DIAGNOSIS — E7800 Pure hypercholesterolemia, unspecified: Secondary | ICD-10-CM | POA: Diagnosis not present

## 2023-08-23 DIAGNOSIS — I10 Essential (primary) hypertension: Secondary | ICD-10-CM | POA: Diagnosis not present

## 2023-08-23 DIAGNOSIS — N1831 Chronic kidney disease, stage 3a: Secondary | ICD-10-CM | POA: Diagnosis not present

## 2023-09-23 DIAGNOSIS — I10 Essential (primary) hypertension: Secondary | ICD-10-CM | POA: Diagnosis not present

## 2023-09-23 DIAGNOSIS — E7800 Pure hypercholesterolemia, unspecified: Secondary | ICD-10-CM | POA: Diagnosis not present

## 2023-09-23 DIAGNOSIS — I255 Ischemic cardiomyopathy: Secondary | ICD-10-CM | POA: Diagnosis not present

## 2023-09-23 DIAGNOSIS — N1831 Chronic kidney disease, stage 3a: Secondary | ICD-10-CM | POA: Diagnosis not present

## 2023-10-14 DIAGNOSIS — I255 Ischemic cardiomyopathy: Secondary | ICD-10-CM | POA: Diagnosis not present

## 2023-10-14 DIAGNOSIS — J302 Other seasonal allergic rhinitis: Secondary | ICD-10-CM | POA: Diagnosis not present

## 2023-10-14 DIAGNOSIS — E7800 Pure hypercholesterolemia, unspecified: Secondary | ICD-10-CM | POA: Diagnosis not present

## 2023-10-24 DIAGNOSIS — N1831 Chronic kidney disease, stage 3a: Secondary | ICD-10-CM | POA: Diagnosis not present

## 2023-10-24 DIAGNOSIS — E7800 Pure hypercholesterolemia, unspecified: Secondary | ICD-10-CM | POA: Diagnosis not present

## 2023-10-24 DIAGNOSIS — I255 Ischemic cardiomyopathy: Secondary | ICD-10-CM | POA: Diagnosis not present

## 2023-10-24 DIAGNOSIS — I10 Essential (primary) hypertension: Secondary | ICD-10-CM | POA: Diagnosis not present

## 2023-11-23 DIAGNOSIS — N1831 Chronic kidney disease, stage 3a: Secondary | ICD-10-CM | POA: Diagnosis not present

## 2023-11-23 DIAGNOSIS — I255 Ischemic cardiomyopathy: Secondary | ICD-10-CM | POA: Diagnosis not present

## 2023-11-23 DIAGNOSIS — I10 Essential (primary) hypertension: Secondary | ICD-10-CM | POA: Diagnosis not present

## 2023-11-23 DIAGNOSIS — E7800 Pure hypercholesterolemia, unspecified: Secondary | ICD-10-CM | POA: Diagnosis not present

## 2023-12-24 DIAGNOSIS — N1831 Chronic kidney disease, stage 3a: Secondary | ICD-10-CM | POA: Diagnosis not present

## 2023-12-24 DIAGNOSIS — E7800 Pure hypercholesterolemia, unspecified: Secondary | ICD-10-CM | POA: Diagnosis not present

## 2023-12-24 DIAGNOSIS — I10 Essential (primary) hypertension: Secondary | ICD-10-CM | POA: Diagnosis not present

## 2023-12-24 DIAGNOSIS — I255 Ischemic cardiomyopathy: Secondary | ICD-10-CM | POA: Diagnosis not present

## 2024-01-23 DIAGNOSIS — N1831 Chronic kidney disease, stage 3a: Secondary | ICD-10-CM | POA: Diagnosis not present

## 2024-01-23 DIAGNOSIS — I10 Essential (primary) hypertension: Secondary | ICD-10-CM | POA: Diagnosis not present

## 2024-01-23 DIAGNOSIS — I255 Ischemic cardiomyopathy: Secondary | ICD-10-CM | POA: Diagnosis not present

## 2024-01-23 DIAGNOSIS — E7800 Pure hypercholesterolemia, unspecified: Secondary | ICD-10-CM | POA: Diagnosis not present

## 2024-02-11 ENCOUNTER — Other Ambulatory Visit: Payer: Self-pay | Admitting: Cardiology

## 2024-03-14 NOTE — Progress Notes (Signed)
 Allison Sanders                                          MRN: [id number]   03/14/2024   The [facility]BCI Quality Team Specialist reviewed this patient medical record for the purposes of chart review for care gap closure. The following were reviewed: chart review for care gap closure-breast cancer screening.    VBCI Quality Team
